# Patient Record
Sex: Female | Born: 1980 | Race: White | Hispanic: No | Marital: Married | Smoking: Former smoker
Health system: Southern US, Community
[De-identification: ages and names within clinical notes are randomized; demographics above are authoritative.]

## PROBLEM LIST (undated history)

## (undated) DIAGNOSIS — K219 Gastro-esophageal reflux disease without esophagitis: Secondary | ICD-10-CM

## (undated) DIAGNOSIS — Z91018 Allergy to other foods: Secondary | ICD-10-CM

## (undated) DIAGNOSIS — Z9889 Other specified postprocedural states: Secondary | ICD-10-CM

## (undated) DIAGNOSIS — R42 Dizziness and giddiness: Secondary | ICD-10-CM

## (undated) DIAGNOSIS — I10 Essential (primary) hypertension: Secondary | ICD-10-CM

## (undated) DIAGNOSIS — IMO0001 Reserved for inherently not codable concepts without codable children: Secondary | ICD-10-CM

## (undated) HISTORY — DX: Allergy to other foods: Z91.018

## (undated) HISTORY — DX: Essential (primary) hypertension: I10

## (undated) HISTORY — PX: KNEE RECONSTRUCTION: SHX5883

---

## 2012-05-29 ENCOUNTER — Emergency Department (HOSPITAL_COMMUNITY): Payer: Medicaid - Out of State

## 2012-05-29 ENCOUNTER — Encounter (HOSPITAL_COMMUNITY): Payer: Self-pay | Admitting: Emergency Medicine

## 2012-05-29 ENCOUNTER — Emergency Department (HOSPITAL_COMMUNITY)
Admission: EM | Admit: 2012-05-29 | Discharge: 2012-05-29 | Disposition: A | Discharge status: Home / Self Care | Payer: Medicaid - Out of State | Attending: Emergency Medicine | Admitting: Emergency Medicine

## 2012-05-29 DIAGNOSIS — R42 Dizziness and giddiness: Secondary | ICD-10-CM | POA: Insufficient documentation

## 2012-05-29 DIAGNOSIS — R0789 Other chest pain: Secondary | ICD-10-CM

## 2012-05-29 DIAGNOSIS — R202 Paresthesia of skin: Secondary | ICD-10-CM

## 2012-05-29 DIAGNOSIS — R209 Unspecified disturbances of skin sensation: Secondary | ICD-10-CM | POA: Insufficient documentation

## 2012-05-29 DIAGNOSIS — R5383 Other fatigue: Secondary | ICD-10-CM | POA: Insufficient documentation

## 2012-05-29 DIAGNOSIS — M545 Low back pain, unspecified: Secondary | ICD-10-CM | POA: Insufficient documentation

## 2012-05-29 DIAGNOSIS — R071 Chest pain on breathing: Secondary | ICD-10-CM | POA: Insufficient documentation

## 2012-05-29 DIAGNOSIS — Z87891 Personal history of nicotine dependence: Secondary | ICD-10-CM | POA: Insufficient documentation

## 2012-05-29 DIAGNOSIS — R5381 Other malaise: Secondary | ICD-10-CM | POA: Insufficient documentation

## 2012-05-29 DIAGNOSIS — Z79899 Other long term (current) drug therapy: Secondary | ICD-10-CM | POA: Insufficient documentation

## 2012-05-29 DIAGNOSIS — R0602 Shortness of breath: Secondary | ICD-10-CM | POA: Insufficient documentation

## 2012-05-29 LAB — COMPREHENSIVE METABOLIC PANEL
BUN: 10 mg/dL (ref 6–23)
CO2: 28 mEq/L (ref 19–32)
Chloride: 101 mEq/L (ref 96–112)
Creatinine, Ser: 0.74 mg/dL (ref 0.50–1.10)
GFR calc non Af Amer: >90 mL/min (ref >90)
Glucose, Bld: 85 mg/dL (ref 70–99)
Total Bilirubin: 0.4 mg/dL (ref 0.3–1.2)

## 2012-05-29 LAB — CBC WITH DIFFERENTIAL/PLATELET
Basophils Absolute: 0 10*3/uL (ref 0.0–0.1)
Eosinophils Absolute: 0.4 10*3/uL (ref 0.0–0.7)
Lymphocytes Relative: 27 % (ref 12–46)
Lymphs Abs: 4.9 10*3/uL — ABNORMAL HIGH (ref 0.7–4.0)
MCH: 30.1 pg (ref 26.0–34.0)
Neutrophils Relative %: 67 % (ref 43–77)
Platelets: 273 10*3/uL (ref 150–400)
RBC: 4.49 MIL/uL (ref 3.87–5.11)
WBC: 17.9 10*3/uL — ABNORMAL HIGH (ref 4.0–10.5)

## 2012-05-29 LAB — URINALYSIS, ROUTINE W REFLEX MICROSCOPIC
Bilirubin Urine: NEGATIVE
Nitrite: NEGATIVE
Protein, ur: NEGATIVE mg/dL
Specific Gravity, Urine: 1.015 (ref 1.005–1.030)
Urobilinogen, UA: 0.2 mg/dL (ref 0.0–1.0)

## 2012-05-29 LAB — D-DIMER, QUANTITATIVE: D-Dimer, Quant: <0.27 ug/mL-FEU (ref 0.00–0.48)

## 2012-05-29 LAB — TROPONIN I: Troponin I: <0.3 ng/mL (ref <0.30)

## 2012-05-29 LAB — URINE MICROSCOPIC-ADD ON

## 2012-05-29 MED ORDER — NAPROXEN 250 MG PO TABS: 250.0000 mg | ORAL_TABLET | Freq: Two times a day (BID) | ORAL | Status: DC

## 2012-05-29 MED ORDER — KETOROLAC TROMETHAMINE 30 MG/ML IJ SOLN
30.0000 mg | Freq: Once | INTRAMUSCULAR | Status: AC
Start: 2012-05-29 — End: 2012-05-29
  Administered 2012-05-29: 30 mg via INTRAVENOUS
  Filled 2012-05-29: qty 1

## 2012-05-29 NOTE — ED Notes (Signed)
Pt c/o weakness x2 weeks and chest pain, dizziness since yesterday. Pt states she's been seen by PCP but "he pushes it off". Pt describes chest pain as tight and pulling. Pt denies SOB, headache, NVD.

## 2012-05-29 NOTE — ED Notes (Signed)
Patient c/o mid-sternal chest pain that radiates under breast bilaterally starting yesterday morning. Patient reports shortness of breath, dizziness, weakness, and back pain. Patient also reports feeling like heart heart is racing. Patient states "I haven't been feeling good for a while but my primary doctor just shrugs it off." Per patient numbness and tingling feeling throughout body x2 months.

## 2012-05-29 NOTE — ED Provider Notes (Signed)
History     CSN: [id number]  Arrival date & time 05/29/12  1414   First MD Initiated Contact with Patient 05/29/12 1541      Chief Complaint  Patient presents with  . Chest Pain  . Dizziness  . Numbness    HPI Pt was seen at 1605.   Per pt, c/o gradual onset and persistence of constant mid-sternal chest "pain" for the past "while," worse over the past 2 days.  Pt states the pain worsens with palpation of the area and body position changes.  Describes the pain as "pulling" and "tight."  Has been associated with palpitations, SOB, "dizziness," and generalized weakness.  States her entire body has been feeling "numb and tingling" for the past 2 months.  Pt also c/o gradual onset and persistence of constant lower back "pain" for the past several weeks. Pain worsens with palpation of the area and body position changes. Denies incont/retention of bowel or bladder, no saddle anesthesia, no focal motor weakness, no tingling/numbness in extremities, no fevers, no injury, no abd pain, no dysuria/hematuria, no vaginal bleeding/discharge. Pt states she has been eval by her PMD several times for same but "he just pushes it off."       History reviewed. No pertinent past medical history.  Past Surgical History  Procedure Laterality Date  . Cesarean section      Family History  Problem Relation Age of Onset  . Cancer Other   . Diabetes Other     History  Substance Use Topics  . Smoking status: Former Smoker -- 0.50 packs/day for 3 years    Types: Cigarettes    Quit date: 03/14/1990  . Smokeless tobacco: Never Used  . Alcohol Use: No    OB History   Grav Para Term Preterm Abortions TAB SAB Ect Mult Living   2 1 1  1  1   1       Review of Systems ROS: Statement: All systems negative except as marked or noted in the HPI; Constitutional: Negative for fever and chills. +generalized weakness.; ; Eyes: Negative for eye pain, redness and discharge. ; ; ENMT: Negative for ear pain,  hoarseness, nasal congestion, sinus pressure and sore throat. ; ; Cardiovascular: +CP, SOB, palpitations. Negative for diaphoresis, and peripheral edema. ; ; Respiratory: Negative for cough, wheezing and stridor. ; ; Gastrointestinal: Negative for nausea, vomiting, diarrhea, abdominal pain, blood in stool, hematemesis, jaundice and rectal bleeding. . ; ; Genitourinary: Negative for dysuria, flank pain and hematuria. ; ; Musculoskeletal: +LBP.  Negative for neck pain. Negative for swelling and trauma.; ; Skin: Negative for pruritus, rash, abrasions, blisters, bruising and skin lesion.; ; Neuro: +paresthesias. Negative for headache, lightheadedness and neck stiffness. Negative for weakness, altered level of consciousness , altered mental status, extremity weakness, involuntary movement, seizure and syncope.     Allergies  Review of patient's allergies indicates no known allergies.  Home Medications   Current Outpatient Rx  Name  Route  Sig  Dispense  Refill  . omeprazole (PRILOSEC) 20 MG capsule   Oral   Take 20 mg by mouth every morning.           BP 104/68  Pulse 75  Temp(Src) 98.7 F (37.1 C) (Oral)  Resp 18  Ht 5\' 4"  (1.626 m)  Wt 225 lb (102.059 kg)  BMI 38.6 kg/m2  SpO2 98%  LMP 05/08/2012  Physical Exam 1610: Physical examination:  Nursing notes reviewed; Vital signs and O2 SAT reviewed;  Constitutional: Well  developed, Well nourished, Well hydrated, In no acute distress; Head:  Normocephalic, atraumatic; Eyes: EOMI, PERRL, No scleral icterus; ENMT: Mouth and pharynx normal, Mucous membranes moist; Neck: Supple, Full range of motion, No lymphadenopathy; Cardiovascular: Regular rate and rhythm, No murmur, rub, or gallop; Respiratory: Breath sounds clear & equal bilaterally, No rales, rhonchi, wheezes.  Speaking full sentences with ease, Normal respiratory effort/excursion; Chest: +bilat parasternal areas tender to palp. No rash, no soft tissue crepitus. Movement normal; Abdomen:  Soft, Nontender, Nondistended, Normal bowel sounds; Genitourinary: No CVA tenderness; Spine:  No midline CS, TS, LS tenderness.  +TTP right lower lumbar paraspinal muscles;  Extremities: Pulses normal, No tenderness, No edema, No calf edema or asymmetry.; Neuro: AA&Ox3, Major CN grossly intact.  Speech clear. Climbs on and off stretcher easily by herself. Gait steady. No gross focal motor or sensory deficits in extremities.; Skin: Color normal, Warm, Dry.; Psych:  Affect flat, poor eye contact.    ED Course  Procedures   MDM  MDM Reviewed: previous chart, nursing note and vitals Interpretation: ECG, labs and x-ray    Date: 05/29/2012  Rate: 61  Rhythm: normal sinus rhythm  QRS Axis: normal  Intervals: normal  ST/T Wave abnormalities: normal  Conduction Disutrbances:none  Narrative Interpretation:   Old EKG Reviewed: none available.  Results for orders placed during the hospital encounter of 05/29/12  COMPREHENSIVE METABOLIC PANEL      Result Value Range   Sodium 138  135 - 145 mEq/L   Potassium 4.2  3.5 - 5.1 mEq/L   Chloride 101  96 - 112 mEq/L   CO2 28  19 - 32 mEq/L   Glucose, Bld 85  70 - 99 mg/dL   BUN 10  6 - 23 mg/dL   Creatinine, Ser 1.47  0.50 - 1.10 mg/dL   Calcium 9.4  8.4 - 82.9 mg/dL   Total Protein 7.5  6.0 - 8.3 g/dL   Albumin 3.6  3.5 - 5.2 g/dL   AST 11  0 - 37 U/L   ALT 15  0 - 35 U/L   Alkaline Phosphatase 63  39 - 117 U/L   Total Bilirubin 0.4  0.3 - 1.2 mg/dL   GFR calc non Af Amer >90  >90 mL/min   GFR calc Af Amer >90  >90 mL/min  CBC WITH DIFFERENTIAL      Result Value Range   WBC 17.9 (*) 4.0 - 10.5 K/uL   RBC 4.49  3.87 - 5.11 MIL/uL   Hemoglobin 13.5  12.0 - 15.0 g/dL   HCT 56.2  13.0 - 86.5 %   MCV 88.0  78.0 - 100.0 fL   MCH 30.1  26.0 - 34.0 pg   MCHC 34.2  30.0 - 36.0 g/dL   RDW 78.4  69.6 - 29.5 %   Platelets 273  150 - 400 K/uL   Neutrophils Relative 67  43 - 77 %   Neutro Abs 12.0 (*) 1.7 - 7.7 K/uL   Lymphocytes Relative 27   12 - 46 %   Lymphs Abs 4.9 (*) 0.7 - 4.0 K/uL   Monocytes Relative 3  3 - 12 %   Monocytes Absolute 0.6  0.1 - 1.0 K/uL   Eosinophils Relative 2  0 - 5 %   Eosinophils Absolute 0.4  0.0 - 0.7 K/uL   Basophils Relative 0  0 - 1 %   Basophils Absolute 0.0  0.0 - 0.1 K/uL  TROPONIN I      Result  Value Range   Troponin I <0.30  <0.30 ng/mL  D-DIMER, QUANTITATIVE      Result Value Range   D-Dimer, Quant <0.27  0.00 - 0.48 ug/mL-FEU  URINALYSIS, ROUTINE W REFLEX MICROSCOPIC      Result Value Range   Color, Urine YELLOW  YELLOW   APPearance CLEAR  CLEAR   Specific Gravity, Urine 1.015  1.005 - 1.030   pH 7.5  5.0 - 8.0   Glucose, UA NEGATIVE  NEGATIVE mg/dL   Hgb urine dipstick TRACE (*) NEGATIVE   Bilirubin Urine NEGATIVE  NEGATIVE   Ketones, ur NEGATIVE  NEGATIVE mg/dL   Protein, ur NEGATIVE  NEGATIVE mg/dL   Urobilinogen, UA 0.2  0.0 - 1.0 mg/dL   Nitrite NEGATIVE  NEGATIVE   Leukocytes, UA NEGATIVE  NEGATIVE  PREGNANCY, URINE      Result Value Range   Preg Test, Ur NEGATIVE  NEGATIVE  URINE MICROSCOPIC-ADD ON      Result Value Range   Squamous Epithelial / LPF FEW (*) RARE   WBC, UA 0-2  <3 WBC/hpf   RBC / HPF 0-2  <3 RBC/hpf   Bacteria, UA RARE  RARE   Dg Chest 2 View 05/29/2012  *RADIOLOGY REPORT*  Clinical Data: Chest pain. Dizziness.  Numbness.  Coughing.  CHEST - 2 VIEW  Comparison: 05/19/2012.  Findings: Cardiac silhouette is normal size and shape.  Mediastinal and hilar contours appear stable and within normal limits.  No pulmonary infiltrates or masses are seen. No pleural abnormality is evident. Bones appear average for age.  IMPRESSION: Stable appearance of chest.  No acute or active cardiopulmonary or pleural abnormalities are evident.   Original Report Authenticated By: Onalee Hua Call    Dg Lumbar Spine Complete 05/29/2012  *RADIOLOGY REPORT*  Clinical Data: Low back pain  LUMBAR SPINE - COMPLETE 4+ VIEW  Comparison: 05/19/2012  Findings: Normal alignment.  No fracture or  mass or pars defect. Disc spaces are maintained.  IMPRESSION: Negative   Original Report Authenticated By: Janeece Riggers, M.D.     [phone]:  Pt states she feels better after meds and wants to go home now.  Not symptomatic during orthostatic VS.  WBC elevated, but no signs of infection:  no fever, no UTI, no pneumonia on CXR, abd remains benign. Doubt PE with negative d-dimer and low risk Wells.  Doubt ACS with constant pain x2 days with normal troponin and EKG.  Will tx pain symptomatically at this time. Dx and testing d/w pt.  Questions answered.  Verb understanding, agreeable to d/c home with outpt f/u.             Laray Anger, DO 06/01/12 1935

## 2012-07-12 ENCOUNTER — Emergency Department (HOSPITAL_COMMUNITY)
Admission: EM | Admit: 2012-07-12 | Discharge: 2012-07-13 | Disposition: A | Discharge status: Home / Self Care | Payer: Medicaid - Out of State | Attending: Emergency Medicine | Admitting: Emergency Medicine

## 2012-07-12 ENCOUNTER — Encounter (HOSPITAL_COMMUNITY): Payer: Self-pay | Admitting: *Deleted

## 2012-07-12 DIAGNOSIS — Z87891 Personal history of nicotine dependence: Secondary | ICD-10-CM | POA: Insufficient documentation

## 2012-07-12 DIAGNOSIS — R42 Dizziness and giddiness: Secondary | ICD-10-CM | POA: Insufficient documentation

## 2012-07-12 DIAGNOSIS — K219 Gastro-esophageal reflux disease without esophagitis: Secondary | ICD-10-CM | POA: Insufficient documentation

## 2012-07-12 DIAGNOSIS — Z79899 Other long term (current) drug therapy: Secondary | ICD-10-CM | POA: Insufficient documentation

## 2012-07-12 DIAGNOSIS — R209 Unspecified disturbances of skin sensation: Secondary | ICD-10-CM | POA: Insufficient documentation

## 2012-07-12 HISTORY — DX: Reserved for inherently not codable concepts without codable children: IMO0001

## 2012-07-12 HISTORY — DX: Gastro-esophageal reflux disease without esophagitis: K21.9

## 2012-07-12 HISTORY — DX: Dizziness and giddiness: R42

## 2012-07-12 NOTE — ED Notes (Signed)
MD at bedside. 

## 2012-07-12 NOTE — ED Notes (Signed)
Pt c/o left sided numbness and tingling.extending from head to toe. States she was diagnosed with vertigo x 2 months and fluid in her ears on Tuesday. Denies LOC, difficulty breathing.

## 2012-07-12 NOTE — ED Notes (Signed)
Vertigo for 2 mos. Seen by MD,and dx with fld in ears on Tuesday.  Today began having numbness of lt side of body 7am.

## 2012-07-13 LAB — COMPREHENSIVE METABOLIC PANEL
BUN: 18 mg/dL (ref 6–23)
CO2: 29 mEq/L (ref 19–32)
Calcium: 9.2 mg/dL (ref 8.4–10.5)
Chloride: 99 mEq/L (ref 96–112)
Creatinine, Ser: 0.78 mg/dL (ref 0.50–1.10)
GFR calc Af Amer: >90 mL/min (ref >90)
GFR calc non Af Amer: >90 mL/min (ref >90)
Glucose, Bld: 93 mg/dL (ref 70–99)
Total Bilirubin: 0.3 mg/dL (ref 0.3–1.2)

## 2012-07-13 LAB — CBC WITH DIFFERENTIAL/PLATELET
Eosinophils Relative: 3 % (ref 0–5)
HCT: 37.5 % (ref 36.0–46.0)
Hemoglobin: 12.8 g/dL (ref 12.0–15.0)
Lymphocytes Relative: 38 % (ref 12–46)
Lymphs Abs: 4.8 10*3/uL — ABNORMAL HIGH (ref 0.7–4.0)
MCV: 88 fL (ref 78.0–100.0)
Monocytes Absolute: 0.5 10*3/uL (ref 0.1–1.0)
Monocytes Relative: 4 % (ref 3–12)
RBC: 4.26 MIL/uL (ref 3.87–5.11)
RDW: 12.4 % (ref 11.5–15.5)
WBC: 12.7 10*3/uL — ABNORMAL HIGH (ref 4.0–10.5)

## 2012-07-13 NOTE — ED Provider Notes (Signed)
History     CSN: [id number]  Arrival date & time 07/12/12  2002   First MD Initiated Contact with Patient 07/12/12 2305      Chief Complaint  Patient presents with  . Dizziness    (Consider location/radiation/quality/duration/timing/severity/associated sxs/prior treatment) HPI Comments: Patient presents with complaints of dizziness for the past several days.  She describes this as a spinning sensation that is associated with movement and position.  She was seen by her pcp for this yesterday and had a negative ct.  She was diagnosed with vertigo and prescribed meclizine which did not help.  She started to experience numbness to the left side of her face and left arm today.  No weakness, headache, but continues with feeling dizzy.  Symptoms are aggravated with movement, and improved with rest.  No visual complaints.    The history is provided by the patient.    Past Medical History  Diagnosis Date  . Vertigo   . Reflux     Past Surgical History  Procedure Laterality Date  . Cesarean section      Family History  Problem Relation Age of Onset  . Cancer Other   . Diabetes Other     History  Substance Use Topics  . Smoking status: Former Smoker -- 0.50 packs/day for 3 years    Types: Cigarettes    Quit date: 03/14/1990  . Smokeless tobacco: Never Used  . Alcohol Use: No    OB History   Grav Para Term Preterm Abortions TAB SAB Ect Mult Living   2 1 1  1  1   1       Review of Systems  All other systems reviewed and are negative.    Allergies  Review of patient's allergies indicates no known allergies.  Home Medications   Current Outpatient Rx  Name  Route  Sig  Dispense  Refill  . naproxen (NAPROSYN) 250 MG tablet   Oral   Take 1 tablet (250 mg total) by mouth 2 (two) times daily with a meal.   14 tablet   0   . omeprazole (PRILOSEC) 20 MG capsule   Oral   Take 20 mg by mouth every morning.           BP 108/56  Pulse 63  Temp(Src) 98.1 F (36.7  C) (Oral)  Resp 18  Ht 5\' 4"  (1.626 m)  Wt 225 lb (102.059 kg)  BMI 38.6 kg/m2  SpO2 99%  LMP 07/09/2012  Physical Exam  Nursing note and vitals reviewed. Constitutional: She is oriented to person, place, and time. She appears well-developed and well-nourished. No distress.  HENT:  Head: Normocephalic and atraumatic.  Mouth/Throat: Oropharynx is clear and moist.  TM's clear bilaterally.  Neck: Normal range of motion. Neck supple.  Cardiovascular: Normal rate and regular rhythm.  Exam reveals no gallop and no friction rub.   No murmur heard. Pulmonary/Chest: Effort normal and breath sounds normal. No respiratory distress. She has no wheezes.  Abdominal: Soft. Bowel sounds are normal. She exhibits no distension. There is no tenderness.  Musculoskeletal: Normal range of motion.  Neurological: She is alert and oriented to person, place, and time. No cranial nerve deficit. She exhibits normal muscle tone. Coordination normal.  Skin: Skin is warm and dry. She is not diaphoretic.    ED Course  Procedures (including critical care time)  Labs Reviewed  CBC WITH DIFFERENTIAL - Abnormal; Notable for the following:    WBC 12.7 (*)  Lymphs Abs 4.8 (*)    All other components within normal limits  COMPREHENSIVE METABOLIC PANEL   No results found.   1. Dizziness   2. Vertigo       MDM  CT from yesterday negative.  The labs today are unremarkable as well.  To my exam, she is neurologically intact without any deficits of cranial nerves, strength, or coordination.  I do not feel inclined to repeat the ct scan tonight as her exam is non-focal and symptoms do not sound like an acute cva.  The labs are unremarkable and I believe stable for discharge.  She is to see her pcp if not improving in the next few days.        Geoffery Lyons, MD 07/13/12 [phone]

## 2012-08-22 ENCOUNTER — Encounter (HOSPITAL_COMMUNITY): Payer: Self-pay | Admitting: Emergency Medicine

## 2012-08-22 ENCOUNTER — Emergency Department (HOSPITAL_COMMUNITY)
Admission: EM | Admit: 2012-08-22 | Discharge: 2012-08-23 | Disposition: A | Discharge status: Home / Self Care | Payer: Medicaid - Out of State | Attending: Emergency Medicine | Admitting: Emergency Medicine

## 2012-08-22 DIAGNOSIS — K219 Gastro-esophageal reflux disease without esophagitis: Secondary | ICD-10-CM | POA: Insufficient documentation

## 2012-08-22 DIAGNOSIS — Z87891 Personal history of nicotine dependence: Secondary | ICD-10-CM | POA: Insufficient documentation

## 2012-08-22 DIAGNOSIS — Z79899 Other long term (current) drug therapy: Secondary | ICD-10-CM | POA: Insufficient documentation

## 2012-08-22 DIAGNOSIS — R6883 Chills (without fever): Secondary | ICD-10-CM | POA: Insufficient documentation

## 2012-08-22 DIAGNOSIS — R079 Chest pain, unspecified: Secondary | ICD-10-CM | POA: Insufficient documentation

## 2012-08-22 DIAGNOSIS — J020 Streptococcal pharyngitis: Secondary | ICD-10-CM | POA: Insufficient documentation

## 2012-08-22 DIAGNOSIS — Z3202 Encounter for pregnancy test, result negative: Secondary | ICD-10-CM | POA: Insufficient documentation

## 2012-08-22 NOTE — ED Provider Notes (Addendum)
History  This chart was scribed for [employer]. Colon Branch, MD by Ardelia Mems, ED Scribe. This patient was seen in room [facility] and the patient's care was started at 11:20 PM.   CSN: [id number]  Arrival date & time 08/22/12  2158     Chief Complaint  Patient presents with  . Chest Pain  . Sore Throat     The history is provided by the patient. No language interpreter was used.    HPI Comments: Angela Harding is a 32 y.o. female who presents to the Emergency Department complaining of chills, sore throat, chest  Pains that began this morning. She has felt chilled all day and her sore throat has gotten worse through the course of the day. Her chest has sharp pains when she moves.    Past Medical History  Diagnosis Date  . Vertigo   . Reflux     Past Surgical History  Procedure Laterality Date  . Cesarean section      Family History  Problem Relation Age of Onset  . Cancer Other   . Diabetes Other     History  Substance Use Topics  . Smoking status: Former Smoker -- 0.50 packs/day for 3 years    Types: Cigarettes    Quit date: 03/14/1990  . Smokeless tobacco: Never Used  . Alcohol Use: No    OB History   Grav Para Term Preterm Abortions TAB SAB Ect Mult Living   2 1 1  1  1   1       Review of Systems  Constitutional: Positive for chills. Negative for fever.       10 Systems reviewed and are negative for acute change except as noted in the HPI.  HENT: Positive for sore throat. Negative for congestion.   Eyes: Negative for discharge and redness.  Respiratory: Negative for cough and shortness of breath.   Cardiovascular: Positive for chest pain.  Gastrointestinal: Negative for vomiting and abdominal pain.  Musculoskeletal: Negative for back pain.  Skin: Negative for rash.  Neurological: Negative for syncope, numbness and headaches.  Psychiatric/Behavioral:       No behavior change.   A complete 10 system review of systems was obtained and all systems are  negative except as noted in the HPI and PMH.   Allergies  Review of patient's allergies indicates no known allergies.  Home Medications   Current Outpatient Rx  Name  Route  Sig  Dispense  Refill  . Linaclotide (LINZESS) 145 MCG CAPS   Oral   Take 145 mcg by mouth daily.         Marland Kitchen omeprazole (PRILOSEC) 20 MG capsule   Oral   Take 20 mg by mouth 2 (two) times daily.          . ranitidine (ZANTAC) 150 MG tablet   Oral   Take 150 mg by mouth at bedtime.           Triage Vitals: BP 126/62  Pulse 81  Temp(Src) 97.7 F (36.5 C) (Oral)  Resp 20  Ht 5\' 4"  (1.626 m)  Wt 230 lb (104.327 kg)  BMI 39.46 kg/m2  SpO2 100%  Physical Exam  Nursing note and vitals reviewed. Constitutional: She appears well-developed and well-nourished.  Awake, alert, nontoxic appearance.  HENT:  Head: Normocephalic and atraumatic.  Slight erythema of posterior pharynx  Eyes: EOM are normal. Pupils are equal, round, and reactive to light.  Neck: Normal range of motion. Neck supple.  Cardiovascular: Normal  rate and intact distal pulses.   Pulmonary/Chest: Effort normal and breath sounds normal. She exhibits no tenderness.  Abdominal: Soft. Bowel sounds are normal. There is no tenderness. There is no rebound.  Musculoskeletal: She exhibits no tenderness.  Baseline ROM, no obvious new focal weakness.  Neurological:  Mental status and motor strength appears baseline for patient and situation.  Skin: No rash noted.  Psychiatric: She has a normal mood and affect.    ED Course  Procedures (including critical care time)  DIAGNOSTIC STUDIES: Oxygen Saturation is 100% on RA, normal by my interpretation.    COORDINATION OF CARE: 11:21 PM- Pt advised of plan for treatment and pt agrees.  Results for orders placed during the hospital encounter of 08/22/12  RAPID STREP SCREEN      Result Value Range   Streptococcus, Group A Screen (Direct) POSITIVE (*) NEGATIVE  CBC WITH DIFFERENTIAL       Result Value Range   WBC 19.9 (*) 4.0 - 10.5 K/uL   RBC 4.31  3.87 - 5.11 MIL/uL   Hemoglobin 13.0  12.0 - 15.0 g/dL   HCT 96.2  95.2 - 84.1 %   MCV 89.1  78.0 - 100.0 fL   MCH 30.2  26.0 - 34.0 pg   MCHC 33.9  30.0 - 36.0 g/dL   RDW 32.4  40.1 - 02.7 %   Platelets 262  150 - 400 K/uL   Neutrophils Relative % 78 (*) 43 - 77 %   Neutro Abs 15.6 (*) 1.7 - 7.7 K/uL   Lymphocytes Relative 18  12 - 46 %   Lymphs Abs 3.5  0.7 - 4.0 K/uL   Monocytes Relative 3  3 - 12 %   Monocytes Absolute 0.6  0.1 - 1.0 K/uL   Eosinophils Relative 1  0 - 5 %   Eosinophils Absolute 0.2  0.0 - 0.7 K/uL   Basophils Relative 0  0 - 1 %   Basophils Absolute 0.0  0.0 - 0.1 K/uL  BASIC METABOLIC PANEL      Result Value Range   Sodium 136  135 - 145 mEq/L   Potassium 3.8  3.5 - 5.1 mEq/L   Chloride 98  96 - 112 mEq/L   CO2 28  19 - 32 mEq/L   Glucose, Bld 97  70 - 99 mg/dL   BUN 10  6 - 23 mg/dL   Creatinine, Ser 2.53  0.50 - 1.10 mg/dL   Calcium 9.2  8.4 - 66.4 mg/dL   GFR calc non Af Amer >90  >90 mL/min   GFR calc Af Amer >90  >90 mL/min  URINALYSIS, ROUTINE W REFLEX MICROSCOPIC      Result Value Range   Color, Urine YELLOW  YELLOW   APPearance CLEAR  CLEAR   Specific Gravity, Urine 1.015  1.005 - 1.030   pH 6.0  5.0 - 8.0   Glucose, UA NEGATIVE  NEGATIVE mg/dL   Hgb urine dipstick SMALL (*) NEGATIVE   Bilirubin Urine NEGATIVE  NEGATIVE   Ketones, ur NEGATIVE  NEGATIVE mg/dL   Protein, ur NEGATIVE  NEGATIVE mg/dL   Urobilinogen, UA 0.2  0.0 - 1.0 mg/dL   Nitrite NEGATIVE  NEGATIVE   Leukocytes, UA NEGATIVE  NEGATIVE  PREGNANCY, URINE      Result Value Range   Preg Test, Ur NEGATIVE  NEGATIVE  URINE MICROSCOPIC-ADD ON      Result Value Range   Squamous Epithelial / LPF RARE  RARE  WBC, UA 0-2  <3 WBC/hpf   RBC / HPF 0-2  <3 RBC/hpf     Date: 08/22/2012     2207  Rate: 88  Rhythm: normal sinus rhythm  QRS Axis: normal  Intervals: normal  ST/T Wave abnormalities: normal  Conduction  Disutrbances: none  Narrative Interpretation: unremarkable        MDM  Patient with strep throat. Given Penicillin G. Pt stable in ED with no significant deterioration in condition.The patient appears reasonably screened and/or stabilized for discharge and I doubt any other medical condition or other [facility] requiring further screening, evaluation, or treatment in the ED at this time prior to discharge.  I personally performed the services described in this documentation, which was scribed in my presence. The recorded information has been reviewed and considered.   MDM Reviewed: nursing note and vitals Interpretation: labs               Nicoletta Dress. Colon Branch, MD 08/23/12 0159  Nicoletta Dress. Colon Branch, MD 08/23/12 1610

## 2012-08-22 NOTE — ED Notes (Signed)
Performed EKG in Triage, pulled old copy from MUSE and handed both to Dr Juleen China.

## 2012-08-22 NOTE — ED Notes (Signed)
Pt c/o chest pain when she is moving around and sore throat that started this am.

## 2012-08-23 LAB — URINE MICROSCOPIC-ADD ON

## 2012-08-23 LAB — BASIC METABOLIC PANEL
BUN: 10 mg/dL (ref 6–23)
Chloride: 98 mEq/L (ref 96–112)
GFR calc Af Amer: >90 mL/min (ref >90)
GFR calc non Af Amer: >90 mL/min (ref >90)
Potassium: 3.8 mEq/L (ref 3.5–5.1)
Sodium: 136 mEq/L (ref 135–145)

## 2012-08-23 LAB — URINALYSIS, ROUTINE W REFLEX MICROSCOPIC
Leukocytes, UA: NEGATIVE
Nitrite: NEGATIVE
Specific Gravity, Urine: 1.015 (ref 1.005–1.030)
Urobilinogen, UA: 0.2 mg/dL (ref 0.0–1.0)
pH: 6 (ref 5.0–8.0)

## 2012-08-23 LAB — CBC WITH DIFFERENTIAL/PLATELET
Basophils Relative: 0 % (ref 0–1)
Eosinophils Absolute: 0.2 10*3/uL (ref 0.0–0.7)
Hemoglobin: 13 g/dL (ref 12.0–15.0)
MCH: 30.2 pg (ref 26.0–34.0)
MCHC: 33.9 g/dL (ref 30.0–36.0)
Monocytes Relative: 3 % (ref 3–12)
Neutro Abs: 15.6 10*3/uL — ABNORMAL HIGH (ref 1.7–7.7)
Neutrophils Relative %: 78 % — ABNORMAL HIGH (ref 43–77)
Platelets: 262 10*3/uL (ref 150–400)
RBC: 4.31 MIL/uL (ref 3.87–5.11)

## 2012-08-23 LAB — PREGNANCY, URINE: Preg Test, Ur: NEGATIVE

## 2012-08-23 MED ORDER — PENICILLIN G BENZATHINE 1200000 UNIT/2ML IM SUSP
1.2000 10*6.[IU] | Freq: Once | INTRAMUSCULAR | Status: AC
  Administered 2012-08-23: 1.2 10*6.[IU] via INTRAMUSCULAR
  Filled 2012-08-23: qty 2

## 2012-08-23 NOTE — ED Notes (Signed)
Pt is lying on stretcher asleep at this time.

## 2012-10-01 ENCOUNTER — Emergency Department (HOSPITAL_COMMUNITY)
Admission: EM | Admit: 2012-10-01 | Discharge: 2012-10-01 | Disposition: A | Discharge status: Home / Self Care | Payer: Medicaid - Out of State | Attending: Emergency Medicine | Admitting: Emergency Medicine

## 2012-10-01 ENCOUNTER — Encounter (HOSPITAL_COMMUNITY): Payer: Self-pay

## 2012-10-01 DIAGNOSIS — Z87891 Personal history of nicotine dependence: Secondary | ICD-10-CM | POA: Insufficient documentation

## 2012-10-01 DIAGNOSIS — Z3202 Encounter for pregnancy test, result negative: Secondary | ICD-10-CM | POA: Insufficient documentation

## 2012-10-01 DIAGNOSIS — R300 Dysuria: Secondary | ICD-10-CM | POA: Insufficient documentation

## 2012-10-01 DIAGNOSIS — R112 Nausea with vomiting, unspecified: Secondary | ICD-10-CM

## 2012-10-01 DIAGNOSIS — M549 Dorsalgia, unspecified: Secondary | ICD-10-CM | POA: Insufficient documentation

## 2012-10-01 DIAGNOSIS — R1032 Left lower quadrant pain: Secondary | ICD-10-CM | POA: Insufficient documentation

## 2012-10-01 DIAGNOSIS — R109 Unspecified abdominal pain: Secondary | ICD-10-CM

## 2012-10-01 DIAGNOSIS — K219 Gastro-esophageal reflux disease without esophagitis: Secondary | ICD-10-CM | POA: Insufficient documentation

## 2012-10-01 DIAGNOSIS — Z79899 Other long term (current) drug therapy: Secondary | ICD-10-CM | POA: Insufficient documentation

## 2012-10-01 DIAGNOSIS — R1010 Upper abdominal pain, unspecified: Secondary | ICD-10-CM

## 2012-10-01 DIAGNOSIS — R252 Cramp and spasm: Secondary | ICD-10-CM

## 2012-10-01 LAB — CBC WITH DIFFERENTIAL/PLATELET
Basophils Absolute: 0 10*3/uL (ref 0.0–0.1)
HCT: 38.2 % (ref 36.0–46.0)
Lymphocytes Relative: 14 % (ref 12–46)
Lymphs Abs: 2 10*3/uL (ref 0.7–4.0)
MCV: 89 fL (ref 78.0–100.0)
Monocytes Absolute: 0.3 10*3/uL (ref 0.1–1.0)
Neutro Abs: 12.5 10*3/uL — ABNORMAL HIGH (ref 1.7–7.7)
Platelets: 318 10*3/uL (ref 150–400)
RBC: 4.29 MIL/uL (ref 3.87–5.11)
RDW: 12.6 % (ref 11.5–15.5)
WBC: 14.8 10*3/uL — ABNORMAL HIGH (ref 4.0–10.5)

## 2012-10-01 LAB — PREGNANCY, URINE: Preg Test, Ur: NEGATIVE

## 2012-10-01 LAB — URINALYSIS, ROUTINE W REFLEX MICROSCOPIC
Bilirubin Urine: NEGATIVE
Glucose, UA: 250 mg/dL — AB
Protein, ur: NEGATIVE mg/dL
Urobilinogen, UA: 0.2 mg/dL (ref 0.0–1.0)

## 2012-10-01 LAB — COMPREHENSIVE METABOLIC PANEL
ALT: 16 U/L (ref 0–35)
AST: 12 U/L (ref 0–37)
CO2: 26 mEq/L (ref 19–32)
Chloride: 103 mEq/L (ref 96–112)
GFR calc Af Amer: >90 mL/min (ref >90)
GFR calc non Af Amer: >90 mL/min (ref >90)
Glucose, Bld: 123 mg/dL — ABNORMAL HIGH (ref 70–99)
Sodium: 138 mEq/L (ref 135–145)
Total Bilirubin: 0.2 mg/dL — ABNORMAL LOW (ref 0.3–1.2)

## 2012-10-01 LAB — URINE MICROSCOPIC-ADD ON

## 2012-10-01 MED ORDER — OXYCODONE-ACETAMINOPHEN 5-325 MG PO TABS
1.0000 | ORAL_TABLET | Freq: Once | ORAL | Status: AC
  Administered 2012-10-01: 1 via ORAL
  Filled 2012-10-01: qty 1

## 2012-10-01 MED ORDER — ONDANSETRON 8 MG PO TBDP: 8.0000 mg | ORAL_TABLET | Freq: Three times a day (TID) | ORAL | Status: DC | PRN

## 2012-10-01 MED ORDER — ONDANSETRON 8 MG PO TBDP
8.0000 mg | ORAL_TABLET | Freq: Once | ORAL | Status: AC
  Administered 2012-10-01: 8 mg via ORAL
  Filled 2012-10-01: qty 1

## 2012-10-01 MED ORDER — HYDROCODONE-ACETAMINOPHEN 5-325 MG PO TABS: 1.0000 | ORAL_TABLET | ORAL | Status: DC | PRN

## 2012-10-01 NOTE — ED Notes (Signed)
Pt c/o LLQ pain with vomiting x 2 days.  Denies diarrhea.  LBM was yesterday.  Reports yesterday, it was hard for her to void.  Says felt like she had to void all day but only small amounts would come out.  Denies any abnormal vaginal bleeding or discharge.

## 2012-10-01 NOTE — ED Provider Notes (Signed)
History  This chart was scribed for Lyanne Co, MD by Ardeen Jourdain, ED Scribe. This patient was seen in room APA03/APA03 and the patient's care was started at 1816.  CSN: [id number] Arrival date & time 10/01/12  1653   First MD Initiated Contact with Patient 10/01/12 1816     Chief Complaint  Patient presents with  . Abdominal Pain    Patient is a 32 y.o. female presenting with abdominal pain. The history is provided by the patient. No language interpreter was used.  Abdominal Pain This is a new problem. The current episode started 2 days ago. The problem occurs constantly. The problem has been gradually worsening. Associated symptoms include abdominal pain. Pertinent negatives include no chest pain, no headaches and no shortness of breath. The symptoms are aggravated by bending and eating. The symptoms are relieved by lying down and position. She has tried nothing for the symptoms. The treatment provided no relief.    HPI Comments: Angela Harding is a 32 y.o. female who presents to the Emergency Department complaining of gradual onset, gradually worsening, constant LLQ abdominal pain with associated dysuria, back pain, nausea and emesis. She states her last BM was yesterday. She states it has been difficult for her to void. She states the pain is aggravated by eating. She states she has felt like she needed to void all day but only small amounts or urine would come out. She denies any fever, SOB, hematuria, diarrhea, abnormal vaginal bleeding or discharge. Pt states she was evaluated by her PCP today who told her to come to the ED. She states her LMP was around 1 month ago.   Past Medical History  Diagnosis Date  . Vertigo   . Reflux    Past Surgical History  Procedure Laterality Date  . Cesarean section     Family History  Problem Relation Age of Onset  . Cancer Other   . Diabetes Other    History  Substance Use Topics  . Smoking status: Former Smoker -- 0.50 packs/day  for 3 years    Types: Cigarettes    Quit date: 03/14/1990  . Smokeless tobacco: Never Used  . Alcohol Use: No   OB History   Grav Para Term Preterm Abortions TAB SAB Ect Mult Living   2 1 1  1  1   1      Review of Systems  Respiratory: Negative for shortness of breath.   Cardiovascular: Negative for chest pain.  Gastrointestinal: Positive for abdominal pain.  Neurological: Negative for headaches.  All other systems reviewed and are negative.   A complete 10 system review of systems was obtained and all systems are negative except as noted in the HPI and PMH.    Allergies  Review of patient's allergies indicates no known allergies.  Home Medications   Current Outpatient Rx  Name  Route  Sig  Dispense  Refill  . Linaclotide (LINZESS) 145 MCG CAPS   Oral   Take 145 mcg by mouth daily.         Marland Kitchen omeprazole (PRILOSEC) 20 MG capsule   Oral   Take 20 mg by mouth 2 (two) times daily.          . ranitidine (ZANTAC) 150 MG tablet   Oral   Take 150 mg by mouth at bedtime.          Triage Vitals: BP 148/88  Pulse 70  Temp(Src) 99 F (37.2 C) (Oral)  Resp 19  Ht  5\' 4"  (1.626 m)  Wt 225 lb (102.059 kg)  BMI 38.6 kg/m2  SpO2 99%  Physical Exam  Nursing note and vitals reviewed. Constitutional: She is oriented to person, place, and time. She appears well-developed and well-nourished. No distress.  HENT:  Head: Normocephalic and atraumatic.  Eyes: EOM are normal.  Neck: Normal range of motion.  Cardiovascular: Normal rate, regular rhythm and normal heart sounds.   Pulmonary/Chest: Effort normal and breath sounds normal.  Abdominal: Soft. She exhibits no distension. There is no rebound and no guarding.  Mild upper abd tenderness without guarding or rebound  Musculoskeletal: Normal range of motion. She exhibits no edema and no tenderness.  No lower extremity swelling or tenderness   Neurological: She is alert and oriented to person, place, and time.  Skin: Skin is  warm and dry.  Psychiatric: She has a normal mood and affect. Judgment normal.    ED Course  Procedures (including critical care time)  DIAGNOSTIC STUDIES: Oxygen Saturation is 99% on room air, normal by my interpretation.    COORDINATION OF CARE:  6:25 PM-Discussed treatment plan which includes UA, pregnancy test, CBC, CMP, lipase, anti-nausea medication and pain medication with pt at bedside and pt agreed to plan.   Labs Reviewed  URINALYSIS, ROUTINE W REFLEX MICROSCOPIC - Abnormal; Notable for the following:    Glucose, UA 250 (*)    Hgb urine dipstick SMALL (*)    All other components within normal limits  URINE MICROSCOPIC-ADD ON - Abnormal; Notable for the following:    Squamous Epithelial / LPF FEW (*)    All other components within normal limits  CBC WITH DIFFERENTIAL - Abnormal; Notable for the following:    WBC 14.8 (*)    Neutrophils Relative % 84 (*)    Neutro Abs 12.5 (*)    Monocytes Relative 2 (*)    All other components within normal limits  COMPREHENSIVE METABOLIC PANEL - Abnormal; Notable for the following:    Glucose, Bld 123 (*)    Total Bilirubin 0.2 (*)    All other components within normal limits  PREGNANCY, URINE  LIPASE, BLOOD  D-DIMER, QUANTITATIVE   No results found. 1. Abdominal pain   2. Leg cramps     MDM  Symptoms could represent biliary colic.  Only mild upper abdominal tenderness at this time.  Negative Murphy's sign.  My suspicion for cholecystitis is very low.  Patient will return first thing in the morning for an ultrasound of her abdomen to evaluate for cholelithiasis.  The patient was concerned about the possibilities of blood clots in her lower legs given her cramping.  D-dimer is negative.  No indication for additional testing regarding her lower legs besides PCP followup.   I personally performed the services described in this documentation, which was scribed in my presence. The recorded information has been reviewed and is  accurate.      Lyanne Co, MD 10/01/12 [phone]

## 2012-10-02 ENCOUNTER — Ambulatory Visit (HOSPITAL_COMMUNITY)
Admit: 2012-10-02 | Discharge: 2012-10-02 | Disposition: A | Discharge status: Home / Self Care | Payer: Medicaid - Out of State | Attending: Emergency Medicine | Admitting: Emergency Medicine

## 2012-10-02 DIAGNOSIS — R112 Nausea with vomiting, unspecified: Secondary | ICD-10-CM

## 2012-10-02 DIAGNOSIS — R1010 Upper abdominal pain, unspecified: Secondary | ICD-10-CM

## 2012-10-02 DIAGNOSIS — R109 Unspecified abdominal pain: Secondary | ICD-10-CM | POA: Insufficient documentation

## 2013-04-03 ENCOUNTER — Encounter (HOSPITAL_COMMUNITY): Payer: Self-pay | Admitting: Emergency Medicine

## 2013-04-03 ENCOUNTER — Emergency Department (HOSPITAL_COMMUNITY)
Admission: EM | Admit: 2013-04-03 | Discharge: 2013-04-03 | Disposition: A | Discharge status: Home / Self Care | Payer: Medicaid - Out of State | Attending: Emergency Medicine | Admitting: Emergency Medicine

## 2013-04-03 DIAGNOSIS — IMO0002 Reserved for concepts with insufficient information to code with codable children: Secondary | ICD-10-CM | POA: Insufficient documentation

## 2013-04-03 DIAGNOSIS — N390 Urinary tract infection, site not specified: Secondary | ICD-10-CM | POA: Insufficient documentation

## 2013-04-03 DIAGNOSIS — Z79899 Other long term (current) drug therapy: Secondary | ICD-10-CM | POA: Insufficient documentation

## 2013-04-03 DIAGNOSIS — N760 Acute vaginitis: Secondary | ICD-10-CM | POA: Insufficient documentation

## 2013-04-03 DIAGNOSIS — K219 Gastro-esophageal reflux disease without esophagitis: Secondary | ICD-10-CM | POA: Insufficient documentation

## 2013-04-03 DIAGNOSIS — Z87891 Personal history of nicotine dependence: Secondary | ICD-10-CM | POA: Insufficient documentation

## 2013-04-03 DIAGNOSIS — Z3202 Encounter for pregnancy test, result negative: Secondary | ICD-10-CM | POA: Insufficient documentation

## 2013-04-03 DIAGNOSIS — R42 Dizziness and giddiness: Secondary | ICD-10-CM | POA: Insufficient documentation

## 2013-04-03 LAB — URINALYSIS, ROUTINE W REFLEX MICROSCOPIC
Bilirubin Urine: NEGATIVE
Glucose, UA: NEGATIVE mg/dL
Ketones, ur: NEGATIVE mg/dL
NITRITE: NEGATIVE
PROTEIN: NEGATIVE mg/dL
SPECIFIC GRAVITY, URINE: 1.025 (ref 1.005–1.030)
UROBILINOGEN UA: 0.2 mg/dL (ref 0.0–1.0)
pH: 6 (ref 5.0–8.0)

## 2013-04-03 LAB — PREGNANCY, URINE: PREG TEST UR: NEGATIVE

## 2013-04-03 LAB — URINE MICROSCOPIC-ADD ON

## 2013-04-03 MED ORDER — IBUPROFEN 800 MG PO TABS
800.0000 mg | ORAL_TABLET | Freq: Once | ORAL | Status: AC
  Administered 2013-04-03: 800 mg via ORAL

## 2013-04-03 MED ORDER — FLUCONAZOLE 100 MG PO TABS
200.0000 mg | ORAL_TABLET | Freq: Once | ORAL | Status: AC
  Administered 2013-04-03: 200 mg via ORAL
  Filled 2013-04-03: qty 2

## 2013-04-03 MED ORDER — IBUPROFEN 800 MG PO TABS
ORAL_TABLET | ORAL | Status: AC
  Filled 2013-04-03: qty 1

## 2013-04-03 NOTE — Discharge Instructions (Signed)
Your urine analysis is negative for acute infection. I suspect that you have a vaginitis following the use of antibiotics. Cool tap water soaks maybe helpful. You've been treated with Diflucan here in the emergency department. Please see your physician if not improving. Vaginitis Vaginitis is an inflammation of the vagina. It is most often caused by a change in the normal balance of the bacteria and yeast that live in the vagina. This change in balance causes an overgrowth of certain bacteria or yeast, which causes the inflammation. There are different types of vaginitis, but the most common types are:  Bacterial vaginosis.  Yeast infection (candidiasis).  Trichomoniasis vaginitis. This is a sexually transmitted infection (STI).  Viral vaginitis.  Atropic vaginitis.  Allergic vaginitis. CAUSES  The cause depends on the type of vaginitis. Vaginitis can be caused by:  Bacteria (bacterial vaginosis).  Yeast (yeast infection).  A parasite (trichomoniasis vaginitis)  A virus (viral vaginitis).  Low hormone levels (atrophic vaginitis). Low hormone levels can occur during pregnancy, breastfeeding, or after menopause.  Irritants, such as bubble baths, scented tampons, and feminine sprays (allergic vaginitis). Other factors can change the normal balance of the yeast and bacteria that live in the vagina. These include:  Antibiotic medicines.  Poor hygiene.  Diaphragms, vaginal sponges, spermicides, birth control pills, and intrauterine devices (IUD).  Sexual intercourse.  Infection.  Uncontrolled diabetes.  A weakened immune system. SYMPTOMS  Symptoms can vary depending on the cause of the vaginitis. Common symptoms include:  Abnormal vaginal discharge.  The discharge is white, gray, or yellow with bacterial vaginosis.  The discharge is thick, white, and cheesy with a yeast infection.  The discharge is frothy and yellow or greenish with trichomoniasis.  A bad vaginal  odor.  The odor is fishy with bacterial vaginosis.  Vaginal itching, pain, or swelling.  Painful intercourse.  Pain or burning when urinating. Sometimes, there are no symptoms. TREATMENT  Treatment will vary depending on the type of infection.   Bacterial vaginosis and trichomoniasis are often treated with antibiotic creams or pills.  Yeast infections are often treated with antifungal medicines, such as vaginal creams or suppositories.  Viral vaginitis has no cure, but symptoms can be treated with medicines that relieve discomfort. Your sexual partner should be treated as well.  Atrophic vaginitis may be treated with an estrogen cream, pill, suppository, or vaginal ring. If vaginal dryness occurs, lubricants and moisturizing creams may help. You may be told to avoid scented soaps, sprays, or douches.  Allergic vaginitis treatment involves quitting the use of the product that is causing the problem. Vaginal creams can be used to treat the symptoms. HOME CARE INSTRUCTIONS   Take all medicines as directed by your caregiver.  Keep your genital area clean and dry. Avoid soap and only rinse the area with water.  Avoid douching. It can remove the healthy bacteria in the vagina.  Do not use tampons or have sexual intercourse until your vaginitis has been treated. Use sanitary pads while you have vaginitis.  Wipe from front to back. This avoids the spread of bacteria from the rectum to the vagina.  Let air reach your genital area.  Wear cotton underwear to decrease moisture buildup.  Avoid wearing underwear while you sleep until your vaginitis is gone.  Avoid tight pants and underwear or nylons without a cotton panel.  Take off wet clothing (especially bathing suits) as soon as possible.  Use mild, non-scented products. Avoid using irritants, such as:  Scented feminine sprays.  Fabric softeners.  Scented detergents.  Scented tampons.  Scented soaps or bubble  baths.  Practice safe sex and use condoms. Condoms may prevent the spread of trichomoniasis and viral vaginitis. SEEK MEDICAL CARE IF:   You have abdominal pain.  You have a fever or persistent symptoms for more than 2 3 days.  You have a fever and your symptoms suddenly get worse. Document Released: 12/26/2006 Document Revised: 11/23/2011 Document Reviewed: 08/11/2011 [facility] Patient Information 2014 [place]ExitCare, [place].

## 2013-04-03 NOTE — ED Notes (Signed)
Patient c/o burning with urination and pressure.

## 2013-04-03 NOTE — ED Provider Notes (Signed)
CSN: [id number]     Arrival date & time 04/03/13  2014 History   First MD Initiated Contact with Patient 04/03/13 2104     Chief Complaint  Patient presents with  . Urinary Tract Infection   (Consider location/radiation/quality/duration/timing/severity/associated sxs/prior Treatment) HPI Comments: Patient states that she has recently been treated with Augmentin for an infection. She now has been having 3 days of burning with urination. She also has a sensation of pressure when urinating. She has noticed a discharge with increased redness in the vaginal area. There is some discomfort most of the time, but it is extremely uncomfortable with urination.  Patient is a 33 y.o. female presenting with urinary tract infection. The history is provided by the patient.  Urinary Tract Infection This is a new problem. The problem occurs daily. The problem has been gradually worsening. Pertinent negatives include no abdominal pain, arthralgias, chest pain, coughing, fever, neck pain or vomiting. Associated symptoms comments: Pressure sensation Burning with urination.. Exacerbated by: urination. She has tried nothing for the symptoms. The treatment provided no relief.    Past Medical History  Diagnosis Date  . Vertigo   . Reflux    Past Surgical History  Procedure Laterality Date  . Cesarean section     Family History  Problem Relation Age of Onset  . Cancer Other   . Diabetes Other    History  Substance Use Topics  . Smoking status: Former Smoker -- 0.50 packs/day for 3 years    Types: Cigarettes    Quit date: 03/14/1990  . Smokeless tobacco: Never Used  . Alcohol Use: No   OB History   Grav Para Term Preterm Abortions TAB SAB Ect Mult Living   2 1 1  1  1   1      Review of Systems  Constitutional: Negative for fever and activity change.       All ROS Neg except as noted in HPI  HENT: Negative for nosebleeds.   Eyes: Negative for photophobia and discharge.  Respiratory: Negative for  cough, shortness of breath and wheezing.   Cardiovascular: Negative for chest pain and palpitations.  Gastrointestinal: Negative for vomiting, abdominal pain and blood in stool.  Genitourinary: Negative for dysuria, frequency and hematuria.  Musculoskeletal: Negative for arthralgias, back pain and neck pain.  Skin: Negative.   Neurological: Positive for dizziness. Negative for seizures and speech difficulty.  Psychiatric/Behavioral: Negative for hallucinations and confusion.    Allergies  Dairy aid and Meat extract  Home Medications   Current Outpatient Rx  Name  Route  Sig  Dispense  Refill  . azelastine (ASTELIN) 137 MCG/SPRAY nasal spray   Each Nare   Place 2 sprays into both nostrils 2 (two) times daily.         . fluticasone (FLONASE) 50 MCG/ACT nasal spray   Each Nare   Place 2 sprays into both nostrils daily.         . hydrOXYzine (ATARAX/VISTARIL) 25 MG tablet   Oral   Take 50 mg by mouth at bedtime.         . montelukast (SINGULAIR) 10 MG tablet   Oral   Take 1 tablet by mouth 2 (two) times daily.         Marland Kitchen. omeprazole (PRILOSEC) 20 MG capsule   Oral   Take 20 mg by mouth 2 (two) times daily.          . ranitidine (ZANTAC) 150 MG tablet   Oral   Take  150 mg by mouth at bedtime.          BP 130/61  Pulse 62  Temp(Src) 98 F (36.7 C) (Oral)  Resp 18  Ht 5\' 4"  (1.626 m)  Wt 220 lb (99.791 kg)  BMI 37.74 kg/m2  SpO2 99% Physical Exam  Nursing note and vitals reviewed. Constitutional: She is oriented to person, place, and time. She appears well-developed and well-nourished.  Non-toxic appearance.  HENT:  Head: Normocephalic.  Right Ear: Tympanic membrane and external ear normal.  Left Ear: Tympanic membrane and external ear normal.  Eyes: EOM and lids are normal. Pupils are equal, round, and reactive to light.  Neck: Normal range of motion. Neck supple. Carotid bruit is not present.  Cardiovascular: Normal rate, regular rhythm, normal heart  sounds, intact distal pulses and normal pulses.   Pulmonary/Chest: Breath sounds normal. No respiratory distress.  Abdominal: Soft. Bowel sounds are normal. There is no tenderness. There is no guarding.  No CVAT.  Musculoskeletal: Normal range of motion.  Lymphadenopathy:       Head (right side): No submandibular adenopathy present.       Head (left side): No submandibular adenopathy present.    She has no cervical adenopathy.  Neurological: She is alert and oriented to person, place, and time. She has normal strength. No cranial nerve deficit or sensory deficit.  Skin: Skin is warm and dry.  Psychiatric: She has a normal mood and affect. Her speech is normal.    ED Course  Procedures (including critical care time) Labs Review Labs Reviewed  URINALYSIS, ROUTINE W REFLEX MICROSCOPIC - Abnormal; Notable for the following:    Hgb urine dipstick TRACE (*)    Leukocytes, UA SMALL (*)    All other components within normal limits  URINE MICROSCOPIC-ADD ON - Abnormal; Notable for the following:    Squamous Epithelial / LPF MANY (*)    Bacteria, UA MANY (*)    All other components within normal limits  PREGNANCY, URINE   Imaging Review No results found.  EKG Interpretation   None       MDM  No diagnosis found. **I have reviewed nursing notes, vital signs, and all appropriate lab and imaging results for this patient.* UA reveals trace hgb, small leukocytes. Microscopic exam reveals 0-2 wbc and 3-6 rbc with many bacteria. O/w wnl. Pt discribes vaginal irritation. Suspect dysuria is related to irritation related to vaginitis. Pt treated with diflucan. She will try tepid baths, and gynelotrimin if not improving. Pt to follow up at the health dept if not improving.   Kathie Dike, PA-C 04/05/13 1212

## 2013-04-09 NOTE — ED Provider Notes (Signed)
Medical screening examination/treatment/procedure(s) were performed by non-physician practitioner and as supervising physician I was immediately available for consultation/collaboration.  EKG Interpretation   None       Devoria AlbeIva Destiney Sanabia, MD, Armando GangFACEP   Ward GivensIva L Keiji Melland, MD 04/09/13 [phone]

## 2014-01-13 ENCOUNTER — Encounter (HOSPITAL_COMMUNITY): Payer: Self-pay | Admitting: Emergency Medicine

## 2015-05-15 ENCOUNTER — Emergency Department (HOSPITAL_COMMUNITY): Payer: Medicaid - Out of State

## 2015-05-15 ENCOUNTER — Emergency Department (HOSPITAL_COMMUNITY)
Admission: EM | Admit: 2015-05-15 | Discharge: 2015-05-15 | Disposition: A | Discharge status: Home / Self Care | Payer: Medicaid - Out of State | Attending: Emergency Medicine | Admitting: Emergency Medicine

## 2015-05-15 ENCOUNTER — Encounter (HOSPITAL_COMMUNITY): Payer: Self-pay

## 2015-05-15 DIAGNOSIS — Y929 Unspecified place or not applicable: Secondary | ICD-10-CM | POA: Diagnosis not present

## 2015-05-15 DIAGNOSIS — Y999 Unspecified external cause status: Secondary | ICD-10-CM | POA: Diagnosis not present

## 2015-05-15 DIAGNOSIS — Y939 Activity, unspecified: Secondary | ICD-10-CM | POA: Diagnosis not present

## 2015-05-15 DIAGNOSIS — X58XXXA Exposure to other specified factors, initial encounter: Secondary | ICD-10-CM | POA: Insufficient documentation

## 2015-05-15 DIAGNOSIS — Z79899 Other long term (current) drug therapy: Secondary | ICD-10-CM | POA: Insufficient documentation

## 2015-05-15 DIAGNOSIS — S76211A Strain of adductor muscle, fascia and tendon of right thigh, initial encounter: Secondary | ICD-10-CM | POA: Insufficient documentation

## 2015-05-15 DIAGNOSIS — Z87891 Personal history of nicotine dependence: Secondary | ICD-10-CM | POA: Diagnosis not present

## 2015-05-15 DIAGNOSIS — M25559 Pain in unspecified hip: Secondary | ICD-10-CM

## 2015-05-15 DIAGNOSIS — M25551 Pain in right hip: Secondary | ICD-10-CM | POA: Insufficient documentation

## 2015-05-15 DIAGNOSIS — M545 Low back pain: Secondary | ICD-10-CM | POA: Diagnosis present

## 2015-05-15 DIAGNOSIS — T148XXA Other injury of unspecified body region, initial encounter: Secondary | ICD-10-CM

## 2015-05-15 LAB — POC URINE PREG, ED: Preg Test, Ur: NEGATIVE

## 2015-05-15 MED ORDER — METHOCARBAMOL 500 MG PO TABS: 500.0000 mg | ORAL_TABLET | Freq: Three times a day (TID) | ORAL | Status: DC

## 2015-05-15 MED ORDER — ACETAMINOPHEN-CODEINE #3 300-30 MG PO TABS: 1.0000 | ORAL_TABLET | Freq: Four times a day (QID) | ORAL | Status: DC | PRN

## 2015-05-15 MED ORDER — DIAZEPAM 5 MG PO TABS
5.0000 mg | ORAL_TABLET | Freq: Once | ORAL | Status: AC
  Administered 2015-05-15: 5 mg via ORAL
  Filled 2015-05-15: qty 1

## 2015-05-15 MED ORDER — ACETAMINOPHEN-CODEINE #3 300-30 MG PO TABS
2.0000 | ORAL_TABLET | Freq: Once | ORAL | Status: AC
  Administered 2015-05-15: 1 via ORAL
  Filled 2015-05-15: qty 2

## 2015-05-15 NOTE — ED Provider Notes (Signed)
CSN: [id number]     Arrival date & time 05/15/15  2107 History   First MD Initiated Contact with Patient 05/15/15 2122     Chief Complaint  Patient presents with  . Leg Pain     (Consider location/radiation/quality/duration/timing/severity/associated sxs/prior Treatment) Patient is a 35 y.o. female presenting with back pain. The history is provided by the patient.  Back Pain Location:  Lumbar spine Quality:  Aching Radiates to:  L thigh and R foot Pain severity:  Moderate Pain is:  Same all the time Onset quality:  Gradual Timing:  Intermittent Progression:  Worsening Context: not falling, not jumping from heights, not occupational injury and not recent injury   Relieved by:  Nothing Worsened by:  Movement Associated symptoms: leg pain   Associated symptoms: no bladder incontinence, no bowel incontinence and no perianal numbness   Risk factors: no hx of cancer, no hx of osteoporosis and no vascular disease     Past Medical History  Diagnosis Date  . Vertigo   . Reflux    Past Surgical History  Procedure Laterality Date  . Cesarean section     Family History  Problem Relation Age of Onset  . Cancer Other   . Diabetes Other    Social History  Substance Use Topics  . Smoking status: Former Smoker -- 0.50 packs/day for 3 years    Types: Cigarettes    Quit date: 03/14/1990  . Smokeless tobacco: Never Used  . Alcohol Use: No   OB History    Gravida Para Term Preterm AB TAB SAB Ectopic Multiple Living   2 1 1  1  1   1      Review of Systems  Gastrointestinal: Negative for bowel incontinence.  Genitourinary: Negative for bladder incontinence.  Musculoskeletal: Positive for back pain.  Neurological: Positive for dizziness.  All other systems reviewed and are negative.     Allergies  Dairy aid and Meat extract  Home Medications   Prior to Admission medications   Medication Sig Start Date End Date Taking? Authorizing Provider  azelastine (ASTELIN) 137  MCG/SPRAY nasal spray Place 2 sprays into both nostrils 2 (two) times daily. 03/15/13   Historical Provider, MD  fluticasone (FLONASE) 50 MCG/ACT nasal spray Place 2 sprays into both nostrils daily. 03/15/13   Historical Provider, MD  hydrOXYzine (ATARAX/VISTARIL) 25 MG tablet Take 50 mg by mouth at bedtime.    Historical Provider, MD  montelukast (SINGULAIR) 10 MG tablet Take 1 tablet by mouth 2 (two) times daily. 03/05/13   Historical Provider, MD  omeprazole (PRILOSEC) 20 MG capsule Take 20 mg by mouth 2 (two) times daily.     Historical Provider, MD  ranitidine (ZANTAC) 150 MG tablet Take 150 mg by mouth at bedtime.    Historical Provider, MD   BP 149/82 mmHg  Pulse 86  Temp(Src) 98.5 F (36.9 C) (Oral)  Resp 16  Ht 5\' 4"  (1.626 m)  Wt 102.059 kg  BMI 38.60 kg/m2  SpO2 97%  LMP  (LMP Unknown) Physical Exam  Constitutional: She is oriented to person, place, and time. She appears well-developed and well-nourished.  Non-toxic appearance.  HENT:  Head: Normocephalic.  Right Ear: Tympanic membrane and external ear normal.  Left Ear: Tympanic membrane and external ear normal.  Eyes: EOM and lids are normal. Pupils are equal, round, and reactive to light.  Neck: Normal range of motion. Neck supple. Carotid bruit is not present.  Cardiovascular: Normal rate, regular rhythm, normal heart sounds, intact distal  pulses and normal pulses.   Pulmonary/Chest: Breath sounds normal. No respiratory distress.  Abdominal: Soft. Bowel sounds are normal. There is no tenderness. There is no guarding.  Musculoskeletal: Normal range of motion.       Right hip: She exhibits tenderness. She exhibits no deformity.       Lumbar back: She exhibits normal range of motion, no tenderness, no deformity and no pain.       Legs: There is good range of motion of the right hip, right knee, and right ankle. There is a negative Homans sign. Is no increased redness of the leg. No hot joints appreciated. No noted swelling of  the right lower extremity. The dorsalis pedis pulses 2+ bilaterally.  Lymphadenopathy:       Head (right side): No submandibular adenopathy present.       Head (left side): No submandibular adenopathy present.    She has no cervical adenopathy.  Neurological: She is alert and oriented to person, place, and time. She has normal strength. No cranial nerve deficit or sensory deficit.  Skin: Skin is warm and dry.  Psychiatric: She has a normal mood and affect. Her speech is normal.  Nursing note and vitals reviewed.   ED Course  Procedures (including critical care time) Labs Review Labs Reviewed - No data to display  Imaging Review No results found. I have personally reviewed and evaluated these images and lab results as part of my medical decision-making.   EKG Interpretation None      MDM  X-ray of the lumbar spine, hip and pelvis are all negative. The examination favors a muscle strain, or musculoskeletal pain. Patient advised to use warm tub soaks. Prescription for Robaxin and Tylenol codeine given to the patient. The patient is to follow-up with Dr. Romeo Apple for orthopedic evaluation if not improving.    Final diagnoses:  Hip pain  Muscle strain    *I have reviewed nursing notes, vital signs, and all appropriate lab and imaging results for this patient.[address] PA-C 05/15/15 8119  Vanetta Mulders, MD 05/16/15 [phone]

## 2015-05-15 NOTE — ED Notes (Signed)
Hurting in lower back, down my right leg, and into my toes. No known injury.

## 2015-05-15 NOTE — ED Notes (Signed)
Pt verbalized understanding of no driving and to use caution within 4 hours of taking pain meds due to meds cause drowsiness 

## 2015-05-15 NOTE — Discharge Instructions (Signed)
Musculoskeletal Pain YOUR VITAL SIGNS ARE WITHIN NORMAL LIMITS. YOUR XRAYS ARE NEGTIVE. PLEASE USE WARM TUB SOAKS TO THE RIGHT THIGH. USE ROBAXIN AND IBUPROFEN FOR PAIN. USE TYLENOL CODEINE FOR MORE SEVERE PAIN. SEE DR HARRISON FOR ORTHOPEDIC EVALUATION IF NOT IMPROVING.                                                                                                                                    Musculoskeletal pain is muscle and boney aches and pains. These pains can occur in any part of the body. Your caregiver may treat you without knowing the cause of the pain. They may treat you if blood or urine tests, X-rays, and other tests were normal.  CAUSES There is often not a definite cause or reason for these pains. These pains may be caused by a type of germ (virus). The discomfort may also come from overuse. Overuse includes working out too hard when your body is not fit. Boney aches also come from weather changes. Bone is sensitive to atmospheric pressure changes. HOME CARE INSTRUCTIONS   Ask when your test results will be ready. Make sure you get your test results.  Only take over-the-counter or prescription medicines for pain, discomfort, or fever as directed by your caregiver. If you were given medications for your condition, do not drive, operate machinery or power tools, or sign legal documents for 24 hours. Do not drink alcohol. Do not take sleeping pills or other medications that may interfere with treatment.  Continue all activities unless the activities cause more pain. When the pain lessens, slowly resume normal activities. Gradually increase the intensity and duration of the activities or exercise.  During periods of severe pain, bed rest may be helpful. Lay or sit in any position that is comfortable.  Putting ice on the injured area.  Put ice in a bag.  Place a towel between your skin and the bag.  Leave the ice on for 15 to 20 minutes, 3 to 4 times a day.  Follow up with your  caregiver for continued problems and no reason can be found for the pain. If the pain becomes worse or does not go away, it may be necessary to repeat tests or do additional testing. Your caregiver may need to look further for a possible cause. SEEK IMMEDIATE MEDICAL CARE IF:  You have pain that is getting worse and is not relieved by medications.  You develop chest pain that is associated with shortness or breath, sweating, feeling sick to your stomach (nauseous), or throw up (vomit).  Your pain becomes localized to the abdomen.  You develop any new symptoms that seem different or that concern you. MAKE SURE YOU:   Understand these instructions.  Will watch your condition.  Will get help right away if you are not doing well or get worse.   This information is not intended to replace advice given to you by  your health care provider. Make sure you discuss any questions you have with your health care provider.   Document Released: 02/28/2005 Document Revised: 05/23/2011 Document Reviewed: 11/02/2012 Elsevier Interactive Patient Education [employer]2016 Elsevier Inc.

## 2015-06-25 ENCOUNTER — Encounter (HOSPITAL_COMMUNITY): Payer: Self-pay

## 2015-06-25 ENCOUNTER — Emergency Department (HOSPITAL_COMMUNITY)
Admission: EM | Admit: 2015-06-25 | Discharge: 2015-06-25 | Disposition: A | Discharge status: Home / Self Care | Payer: Medicaid - Out of State | Attending: Emergency Medicine | Admitting: Emergency Medicine

## 2015-06-25 DIAGNOSIS — Z79899 Other long term (current) drug therapy: Secondary | ICD-10-CM | POA: Diagnosis not present

## 2015-06-25 DIAGNOSIS — Z87891 Personal history of nicotine dependence: Secondary | ICD-10-CM | POA: Diagnosis not present

## 2015-06-25 DIAGNOSIS — R112 Nausea with vomiting, unspecified: Secondary | ICD-10-CM | POA: Diagnosis not present

## 2015-06-25 DIAGNOSIS — K5900 Constipation, unspecified: Secondary | ICD-10-CM | POA: Diagnosis present

## 2015-06-25 MED ORDER — ONDANSETRON 4 MG PO TBDP: 4.0000 mg | ORAL_TABLET | Freq: Three times a day (TID) | ORAL | Status: DC | PRN

## 2015-06-25 MED ORDER — DOCUSATE SODIUM 100 MG PO CAPS: 100.0000 mg | ORAL_CAPSULE | Freq: Two times a day (BID) | ORAL | Status: DC

## 2015-06-25 MED ORDER — DICYCLOMINE HCL 20 MG PO TABS: 20.0000 mg | ORAL_TABLET | Freq: Three times a day (TID) | ORAL | Status: DC

## 2015-06-25 NOTE — ED Notes (Signed)
Pt states history of constipation. States medication her PCP had her on not working. Pt states constipation has never been this bad. Pt denies any urinary problems.

## 2015-06-25 NOTE — ED Provider Notes (Signed)
By signing my name below, I, Tanda RockersMargaux Venter, attest that this documentation has been prepared under the direction and in the presence of Enbridge EnergyKristen N Ward, DO. Electronically Signed: Tanda RockersMargaux Venter, ED Scribe. 06/25/2015. 11:18 PM.  TIME SEEN: 11:11 PM  CHIEF COMPLAINT: Constipation  HPI:  Angela Harding is a 35 y.o. female who presents to the Emergency Department complaining of gradual onset, constant, constipation x 2 weeks. Pt states that she has been passing small amounts of "fatty" stool but has not had a regular bowel movement. She has had issues with constipation in the past but it has never been this severe. Pt also complains of nausea and vomiting. The last time she vomited was 1 PM today (approximately 10 hours ago). Pt has hx of IBS and takes Linzess for it which is prescribed by her GI, Dr. Allena KatzPatel, in [place]. She has been taking this as well as Miralax once per day, Citrate, enemas, and suppositories without relief. Denies fever, chills, or any other associated symptoms.   S/p c section.  No h/o SBO.    ROS: See HPI Constitutional: no fever  Eyes: no drainage  ENT: no runny nose   Cardiovascular:  no chest pain  Resp: no SOB  GI: nausea, vomiting, and constipation GU: no dysuria Integumentary: no rash  Allergy: no hives  Musculoskeletal: no leg swelling  Neurological: no slurred speech ROS otherwise negative  PAST MEDICAL HISTORY/PAST SURGICAL HISTORY:  Past Medical History  Diagnosis Date  . Vertigo   . Reflux     MEDICATIONS:  Prior to Admission medications   Medication Sig Start Date End Date Taking? Authorizing Provider  acetaminophen (TYLENOL) 500 MG tablet Take 500-1,000 mg by mouth every 6 (six) hours as needed for mild pain or moderate pain.    Historical Provider, MD  acetaminophen-codeine (TYLENOL #3) 300-30 MG tablet Take 1-2 tablets by mouth every 6 (six) hours as needed for moderate pain. 05/15/15   Ivery QualeHobson Bryant, PA-C  azelastine (ASTELIN) 137 MCG/SPRAY  nasal spray Place 2 sprays into both nostrils 2 (two) times daily. 03/15/13   Historical Provider, MD  fluticasone (FLONASE) 50 MCG/ACT nasal spray Place 2 sprays into both nostrils daily. 03/15/13   Historical Provider, MD  methocarbamol (ROBAXIN) 500 MG tablet Take 1 tablet (500 mg total) by mouth 3 (three) times daily. 05/15/15   Ivery QualeHobson Bryant, PA-C  montelukast (SINGULAIR) 10 MG tablet Take 1 tablet by mouth daily.  03/05/13   Historical Provider, MD  ranitidine (ZANTAC) 150 MG tablet Take 150 mg by mouth at bedtime.    Historical Provider, MD    ALLERGIES:  Allergies  Allergen Reactions  . Dairy Aid [Lactase]   . Meat Extract     SOCIAL HISTORY:  Social History  Substance Use Topics  . Smoking status: Former Smoker -- 0.50 packs/day for 3 years    Types: Cigarettes    Quit date: 03/14/1990  . Smokeless tobacco: Never Used  . Alcohol Use: No    FAMILY HISTORY: Family History  Problem Relation Age of Onset  . Cancer Other   . Diabetes Other     EXAM: BP 151/82 mmHg  Pulse 67  Temp(Src) 98.1 F (36.7 C) (Oral)  Resp 20  Ht 5\' 4"  (1.626 m)  Wt 230 lb (104.327 kg)  BMI 39.46 kg/m2  SpO2 98%  LMP 04/27/2015 CONSTITUTIONAL: Alert and oriented and responds appropriately to questions. Well-appearing; well-nourished; Febrile, nontoxic HEAD: Normocephalic EYES: Conjunctivae clear, PERRL ENT: normal nose; no rhinorrhea; moist mucous  membranes NECK: Supple, no meningismus, no LAD  CARD: RRR; S1 and S2 appreciated; no murmurs, no clicks, no rubs, no gallops RESP: Normal chest excursion without splinting or tachypnea; breath sounds clear and equal bilaterally; no wheezes, no rhonchi, no rales, no hypoxia or respiratory distress, speaking full sentences ABD/GI: Normal bowel sounds; non-distended; soft, non-tender, no rebound, no guarding, no peritoneal signs, no tympany or fluid wave, no tenderness at the break point, negative Murphy sign, completely benign abdominal exam RECTAL:   Normal rectal tone, no gross blood or melena, guaiac negative, no hemorrhoids appreciated, nontender rectal exam; no fecal impaction, no stool in the rectal vault BACK:  The back appears normal and is non-tender to palpation, there is no CVA tenderness EXT: Normal ROM in all joints; non-tender to palpation; no edema; normal capillary refill; no cyanosis, no calf tenderness or swelling    SKIN: Normal color for age and race; warm; no rash NEURO: Moves all extremities equally, sensation to light touch intact diffusely, cranial nerves II through XII intact PSYCH: The patient's mood and manner are appropriate. Grooming and personal hygiene are appropriate.  MEDICAL DECISION MAKING: Patient here with complaints of constipation. Doubt small bowel obstruction based on her clinical exam. She appears very well-hydrated with nondistended, nontender abdomen.  No fecal impaction on exam. No bloody stool, melena. Have advised her to increase her MiraLAX to 3 times a day and add Colace twice a day. I recommend she add Metamucil or Benefiber to her diet and increase her water intake. We'll give her outpatient follow-up with gastroenterology. Doubt any life-threatening illness at this time. She states she does have intermittent nausea and abdominal cramping. None currently. We'll discharge with prescriptions for Zofran, Bentyl. Have advised her that opiates would make her symptoms worse. Discussed return precautions.   At this time, I do not feel there is any life-threatening condition present. I have reviewed and discussed all results (EKG, imaging, lab, urine as appropriate), exam findings with patient. I have reviewed nursing notes and appropriate previous records.  I feel the patient is safe to be discharged home without further emergent workup. Discussed usual and customary return precautions. Patient and family (if present) verbalize understanding and are comfortable with this plan.  Patient will follow-up with their  primary care provider. If they do not have a primary care provider, information for follow-up has been provided to them. All questions have been answered.  I personally performed the services described in this documentation, which was scribed in my presence. The recorded information has been reviewed and is accurate.    Layla Maw Ward, DO 06/26/15 [phone]

## 2015-06-25 NOTE — Discharge Instructions (Signed)
I recommend that you continue your Linzess.  Please use Metamucil or Benefiber 1 - 2 times a day to help increase your fiber intake.  Increase your water intake as well.  You should be using Miralax three times a day and Colace 100 mg tablets twice a day.  Only stop these medications if you are having diarrhea.  If this is not enough to help you have a bowel movement, you may use Fleet enemas, Dulcolax suppositories, Magnesium citrate as needed.  Please follow up with gastroenterology.   Constipation, Adult Constipation is when a person has fewer than three bowel movements a week, has difficulty having a bowel movement, or has stools that are dry, hard, or larger than normal. As people grow older, constipation is more common. A low-fiber diet, not taking in enough fluids, and taking certain medicines may make constipation worse.  CAUSES   Certain medicines, such as antidepressants, pain medicine, iron supplements, antacids, and water pills.   Certain diseases, such as diabetes, irritable bowel syndrome (IBS), thyroid disease, or depression.   Not drinking enough water.   Not eating enough fiber-rich foods.   Stress or travel.   Lack of physical activity or exercise.   Ignoring the urge to have a bowel movement.   Using laxatives too much.  SIGNS AND SYMPTOMS   Having fewer than three bowel movements a week.   Straining to have a bowel movement.   Having stools that are hard, dry, or larger than normal.   Feeling full or bloated.   Pain in the lower abdomen.   Not feeling relief after having a bowel movement.  DIAGNOSIS  Your health care provider will take a medical history and perform a physical exam. Further testing may be done for severe constipation. Some tests may include:  A barium enema X-ray to examine your rectum, colon, and, sometimes, your small intestine.   A sigmoidoscopy to examine your lower colon.   A colonoscopy to examine your entire  colon. TREATMENT  Treatment will depend on the severity of your constipation and what is causing it. Some dietary treatments include drinking more fluids and eating more fiber-rich foods. Lifestyle treatments may include regular exercise. If these diet and lifestyle recommendations do not help, your health care provider may recommend taking over-the-counter laxative medicines to help you have bowel movements. Prescription medicines may be prescribed if over-the-counter medicines do not work.  HOME CARE INSTRUCTIONS   Eat foods that have a lot of fiber, such as fruits, vegetables, whole grains, and beans.  Limit foods high in fat and processed sugars, such as french fries, hamburgers, cookies, candies, and soda.   A fiber supplement may be added to your diet if you cannot get enough fiber from foods.   Drink enough fluids to keep your urine clear or pale yellow.   Exercise regularly or as directed by your health care provider.   Go to the restroom when you have the urge to go. Do not hold it.   Only take over-the-counter or prescription medicines as directed by your health care provider. Do not take other medicines for constipation without talking to your health care provider first.  SEEK IMMEDIATE MEDICAL CARE IF:   You have bright red blood in your stool.   Your constipation lasts for more than 4 days or gets worse.   You have abdominal or rectal pain.   You have thin, pencil-like stools.   You have unexplained weight loss. MAKE SURE YOU:   Understand  these instructions.  Will watch your condition.  Will get help right away if you are not doing well or get worse.   This information is not intended to replace advice given to you by your health care provider. Make sure you discuss any questions you have with your health care provider.   Document Released: 11/27/2003 Document Revised: 03/21/2014 Document Reviewed: 12/10/2012 Elsevier Interactive Patient Education 2016  Elsevier Inc.  High-Fiber Diet Fiber, also called dietary fiber, is a type of carbohydrate found in fruits, vegetables, whole grains, and beans. A high-fiber diet can have many health benefits. Your health care provider may recommend a high-fiber diet to help:  Prevent constipation. Fiber can make your bowel movements more regular.  Lower your cholesterol.  Relieve hemorrhoids, uncomplicated diverticulosis, or irritable bowel syndrome.  Prevent overeating as part of a weight-loss plan.  Prevent heart disease, type 2 diabetes, and certain cancers. WHAT IS MY PLAN? The recommended daily intake of fiber includes:  38 grams for men under age 14.  30 grams for men over age 57.  25 grams for women under age 59.  21 grams for women over age 76. You can get the recommended daily intake of dietary fiber by eating a variety of fruits, vegetables, grains, and beans. Your health care provider may also recommend a fiber supplement if it is not possible to get enough fiber through your diet. WHAT DO I NEED TO KNOW ABOUT A HIGH-FIBER DIET?  Fiber supplements have not been widely studied for their effectiveness, so it is better to get fiber through food sources.  Always check the fiber content on thenutrition facts label of any prepackaged food. Look for foods that contain at least 5 grams of fiber per serving.  Ask your dietitian if you have questions about specific foods that are related to your condition, especially if those foods are not listed in the following section.  Increase your daily fiber consumption gradually. Increasing your intake of dietary fiber too quickly may cause bloating, cramping, or gas.  Drink plenty of water. Water helps you to digest fiber. WHAT FOODS CAN I EAT? Grains Whole-grain breads. Multigrain cereal. Oats and oatmeal. Brown rice. Barley. Bulgur wheat. Millet. Bran muffins. Popcorn. Rye wafer crackers. Vegetables Sweet potatoes. Spinach. Kale. Artichokes.  Cabbage. Broccoli. Green peas. Carrots. Squash. Fruits Berries. Pears. Apples. Oranges. Avocados. Prunes and raisins. Dried figs. Meats and Other Protein Sources Navy, kidney, pinto, and soy beans. Split peas. Lentils. Nuts and seeds. Dairy Fiber-fortified yogurt. Beverages Fiber-fortified soy milk. Fiber-fortified orange juice. Other Fiber bars. The items listed above may not be a complete list of recommended foods or beverages. Contact your dietitian for more options. WHAT FOODS ARE NOT RECOMMENDED? Grains White bread. Pasta made with refined flour. White rice. Vegetables Fried potatoes. Canned vegetables. Well-cooked vegetables.  Fruits Fruit juice. Cooked, strained fruit. Meats and Other Protein Sources Fatty cuts of meat. Fried Environmental education officer or fried fish. Dairy Milk. Yogurt. Cream cheese. Sour cream. Beverages Soft drinks. Other Cakes and pastries. Butter and oils. The items listed above may not be a complete list of foods and beverages to avoid. Contact your dietitian for more information. WHAT ARE SOME TIPS FOR INCLUDING HIGH-FIBER FOODS IN MY DIET?  Eat a wide variety of high-fiber foods.  Make sure that half of all grains consumed each day are whole grains.  Replace breads and cereals made from refined flour or white flour with whole-grain breads and cereals.  Replace white rice with brown rice, bulgur wheat, or  millet.  Start the day with a breakfast that is high in fiber, such as a cereal that contains at least 5 grams of fiber per serving.  Use beans in place of meat in soups, salads, or pasta.  Eat high-fiber snacks, such as berries, raw vegetables, nuts, or popcorn.   This information is not intended to replace advice given to you by your health care provider. Make sure you discuss any questions you have with your health care provider.   Document Released: 02/28/2005 Document Revised: 03/21/2014 Document Reviewed: 08/13/2013 Elsevier Interactive Patient  Education [employer]

## 2015-06-25 NOTE — ED Notes (Signed)
Pt reports being constipated and only passing small hard balls of stool in the past two weeks. Pt says she has tried laxatives, enemas, suppository's with no relief.

## 2015-06-25 NOTE — ED Notes (Signed)
Pt alert & oriented x4, stable gait. Patient given discharge instructions, paperwork & prescription(s). Patient  instructed to stop at the registration desk to finish any additional paperwork. Patient verbalized understanding. Pt left department w/ no further questions. 

## 2015-10-08 ENCOUNTER — Encounter (HOSPITAL_COMMUNITY): Payer: Self-pay | Admitting: Emergency Medicine

## 2015-10-08 ENCOUNTER — Emergency Department (HOSPITAL_COMMUNITY)
Admission: EM | Admit: 2015-10-08 | Discharge: 2015-10-08 | Disposition: A | Discharge status: Home / Self Care | Payer: BLUE CROSS/BLUE SHIELD | Attending: Emergency Medicine | Admitting: Emergency Medicine

## 2015-10-08 ENCOUNTER — Emergency Department (HOSPITAL_COMMUNITY): Payer: BLUE CROSS/BLUE SHIELD

## 2015-10-08 DIAGNOSIS — Y929 Unspecified place or not applicable: Secondary | ICD-10-CM | POA: Diagnosis not present

## 2015-10-08 DIAGNOSIS — S90112A Contusion of left great toe without damage to nail, initial encounter: Secondary | ICD-10-CM | POA: Diagnosis not present

## 2015-10-08 DIAGNOSIS — W228XXA Striking against or struck by other objects, initial encounter: Secondary | ICD-10-CM | POA: Insufficient documentation

## 2015-10-08 DIAGNOSIS — S90452A Superficial foreign body, left great toe, initial encounter: Secondary | ICD-10-CM | POA: Diagnosis not present

## 2015-10-08 DIAGNOSIS — Y999 Unspecified external cause status: Secondary | ICD-10-CM | POA: Insufficient documentation

## 2015-10-08 DIAGNOSIS — Z23 Encounter for immunization: Secondary | ICD-10-CM | POA: Diagnosis not present

## 2015-10-08 DIAGNOSIS — Y939 Activity, unspecified: Secondary | ICD-10-CM | POA: Diagnosis not present

## 2015-10-08 DIAGNOSIS — Z87891 Personal history of nicotine dependence: Secondary | ICD-10-CM | POA: Insufficient documentation

## 2015-10-08 DIAGNOSIS — S99922A Unspecified injury of left foot, initial encounter: Secondary | ICD-10-CM | POA: Diagnosis present

## 2015-10-08 LAB — POC URINE PREG, ED: Preg Test, Ur: NEGATIVE

## 2015-10-08 MED ORDER — DOXYCYCLINE HYCLATE 100 MG PO CAPS: 100.0000 mg | ORAL_CAPSULE | Freq: Two times a day (BID) | ORAL | 0 refills | Status: DC

## 2015-10-08 MED ORDER — IBUPROFEN 800 MG PO TABS
800.0000 mg | ORAL_TABLET | Freq: Once | ORAL | Status: AC
  Administered 2015-10-08: 800 mg via ORAL
  Filled 2015-10-08: qty 1

## 2015-10-08 MED ORDER — HYDROCODONE-ACETAMINOPHEN 5-325 MG PO TABS: 1.0000 | ORAL_TABLET | ORAL | 0 refills | Status: DC | PRN

## 2015-10-08 MED ORDER — ONDANSETRON HCL 4 MG PO TABS
4.0000 mg | ORAL_TABLET | Freq: Once | ORAL | Status: AC
  Administered 2015-10-08: 4 mg via ORAL
  Filled 2015-10-08: qty 1

## 2015-10-08 MED ORDER — DOXYCYCLINE HYCLATE 100 MG PO TABS
100.0000 mg | ORAL_TABLET | Freq: Once | ORAL | Status: AC
  Administered 2015-10-08: 100 mg via ORAL
  Filled 2015-10-08: qty 1

## 2015-10-08 MED ORDER — TETANUS-DIPHTH-ACELL PERTUSSIS 5-2.5-18.5 LF-MCG/0.5 IM SUSP
0.5000 mL | Freq: Once | INTRAMUSCULAR | Status: AC
  Administered 2015-10-08: 0.5 mL via INTRAMUSCULAR
  Filled 2015-10-08: qty 0.5

## 2015-10-08 MED ORDER — HYDROCODONE-ACETAMINOPHEN 5-325 MG PO TABS
2.0000 | ORAL_TABLET | Freq: Once | ORAL | Status: DC
  Filled 2015-10-08: qty 2

## 2015-10-08 MED ORDER — LIDOCAINE HCL (PF) 1 % IJ SOLN
10.0000 mL | Freq: Once | INTRAMUSCULAR | Status: AC
  Administered 2015-10-08: 5 mL via INTRADERMAL

## 2015-10-08 NOTE — ED Provider Notes (Addendum)
[facility]-EMERGENCY DEPT Provider Note   CSN: [id number] Arrival date & time: 10/08/15  1515  First Provider Contact:  First MD Initiated Contact with Patient 10/08/15 1608        History   Chief Complaint Chief Complaint  Patient presents with  . Toe Injury    HPI KARSTEN VAUGHN is a 35 y.o. female.  Pt is a 34y/o female who injured the left great toe 3 days ago while [place]. She hit her left great toe on a rotten piece of wood and some of the wood went in the toe. She attempted to get it our with tweezers but was unsuccessful. She c/o throbbing pain most of the night and worse this AM. Pt unsure of the date of the last tetanus. She is not diabetic.   The history is provided by the patient.    Past Medical History:  Diagnosis Date  . Reflux   . Vertigo     There are no active problems to display for this patient.   Past Surgical History:  Procedure Laterality Date  . CESAREAN SECTION    . KNEE RECONSTRUCTION      OB History    Gravida Para Term Preterm AB Living   SAB TAB Ectopic Multiple Live Births   1               Home Medications    Prior to Admission medications   Medication Sig Start Date End Date Taking? Authorizing Provider  acetaminophen-codeine (TYLENOL #3) 300-30 MG tablet Take 1-2 tablets by mouth every 6 (six) hours as needed for moderate pain. 05/15/15   Ivery Quale, PA-C  azelastine (ASTELIN) 137 MCG/SPRAY nasal spray Place 2 sprays into both nostrils 2 (two) times daily. 03/15/13   Historical Provider, MD  bisacodyl (DULCOLAX) 10 MG suppository Place 10 mg rectally as needed for moderate constipation.    Historical Provider, MD  bisacodyl (DULCOLAX) 5 MG EC tablet Take 5 mg by mouth daily as needed for mild constipation or moderate constipation.    Historical Provider, MD  dicyclomine (BENTYL) 20 MG tablet Take 1 tablet (20 mg total) by mouth 3 (three) times daily before meals. As needed for abdominal  cramping 06/25/15   Kristen N Ward, DO  docusate sodium (COLACE) 100 MG capsule Take 1 capsule (100 mg total) by mouth every 12 (twelve) hours. 06/25/15   Kristen N Ward, DO  fluticasone (FLONASE) 50 MCG/ACT nasal spray Place 2 sprays into both nostrils daily. 03/15/13   Historical Provider, MD  magnesium citrate SOLN Take 1 Bottle by mouth once.    Historical Provider, MD  methocarbamol (ROBAXIN) 500 MG tablet Take 1 tablet (500 mg total) by mouth 3 (three) times daily. 05/15/15   Ivery Quale, PA-C  mineral oil enema Place 1 enema rectally once.    Historical Provider, MD  montelukast (SINGULAIR) 10 MG tablet Take 1 tablet by mouth at bedtime as needed.  03/05/13   Historical Provider, MD  ondansetron (ZOFRAN ODT) 4 MG disintegrating tablet Take 1 tablet (4 mg total) by mouth every 8 (eight) hours as needed for nausea or vomiting. 06/25/15   Kristen N Ward, DO  ranitidine (ZANTAC) 150 MG tablet Take 150 mg by mouth at bedtime.    Historical Provider, MD    Family History Family History  Problem Relation Age of Onset  . Cancer Other   . Diabetes Other  Social History Social History  Substance Use Topics  . Smoking status: Former Smoker    Packs/day: 0.50    Years: 3.00    Types: Cigarettes    Quit date: 03/14/1990  . Smokeless tobacco: Never Used  . Alcohol use No     Allergies   Dairy aid [lactase] and Meat extract   Review of Systems Review of Systems  Constitutional: Negative for activity change.       All ROS Neg except as noted in HPI  HENT: Negative for nosebleeds.   Eyes: Negative for photophobia and discharge.  Respiratory: Negative for cough, shortness of breath and wheezing.   Cardiovascular: Negative for chest pain and palpitations.  Gastrointestinal: Negative for abdominal pain and blood in stool.  Genitourinary: Negative for dysuria, frequency and hematuria.  Musculoskeletal: Negative for arthralgias, back pain and neck pain.  Skin: Negative.   Neurological:  Negative for dizziness, seizures and speech difficulty.  Psychiatric/Behavioral: Negative for confusion and hallucinations.     Physical Exam Updated Vital Signs BP 137/68 (BP Location: Left Arm)   Pulse 64   Temp 98.2 F (36.8 C) (Oral)   Resp 20   Ht 5\' 4"  (1.626 m)   Wt 99.8 kg   LMP 09/24/2015 (Approximate)   SpO2 99%   BMI 37.76 kg/m   Physical Exam  Constitutional: She is oriented to person, place, and time. She appears well-developed and well-nourished.  Non-toxic appearance.  HENT:  Head: Normocephalic.  Right Ear: Tympanic membrane and external ear normal.  Left Ear: Tympanic membrane and external ear normal.  Eyes: EOM and lids are normal. Pupils are equal, round, and reactive to light.  Neck: Normal range of motion. Neck supple. Carotid bruit is not present.  Cardiovascular: Normal rate, regular rhythm, normal heart sounds, intact distal pulses and normal pulses.   Pulmonary/Chest: Breath sounds normal. No respiratory distress.  Abdominal: Soft. Bowel sounds are normal. There is no tenderness. There is no guarding.  Musculoskeletal:  There is increase redness and swelling of the left great toe. The toe is tender to palpation or movement. A small portion of the nail is missing. The edge of the fb can be visualized. No red streaks noted. No lesion at the web space or plantar surface. DP 2+.   Lymphadenopathy:       Head (right side): No submandibular adenopathy present.       Head (left side): No submandibular adenopathy present.    She has no cervical adenopathy.  Neurological: She is alert and oriented to person, place, and time. She has normal strength. No cranial nerve deficit or sensory deficit.  Skin: Skin is warm and dry.  Psychiatric: She has a normal mood and affect. Her speech is normal.  Nursing note and vitals reviewed.    ED Treatments / Results  Labs (all labs ordered are listed, but only abnormal results are displayed) Labs Reviewed - No data to  display  EKG  EKG Interpretation None       Radiology No results found.  Procedures .Foreign Body Removal Date/Time: 10/08/2015 5:22 PM Performed by: Ivery Quale Authorized by: Ivery Quale  Consent: Verbal consent obtained. Risks and benefits: risks, benefits and alternatives were discussed Consent given by: patient Patient understanding: patient states understanding of the procedure being performed Patient identity confirmed: arm band Time out: Immediately prior to procedure a "time out" was called to verify the correct patient, procedure, equipment, support staff and site/side marked as required. Intake: left great toe. Anesthesia:  digital block  Anesthesia: Local Anesthetic: lidocaine 1% without epinephrine Anesthetic total: 4 mL  Sedation: Patient sedated: no Patient restrained: no Complexity: simple 2 objects recovered. Objects recovered: wood splinters under toe nail Post-procedure assessment: foreign body removed Patient tolerance: Patient tolerated the procedure well with no immediate complications   (including critical care time)  Medications Ordered in ED Medications  lidocaine (PF) (XYLOCAINE) 1 % injection 10 mL (not administered)     Initial Impression / Assessment and Plan / ED Course  I have reviewed the triage vital signs and the nursing notes.  Pertinent labs & imaging results that were available during my care of the patient were reviewed by me and considered in my medical decision making (see chart for details).  Clinical Course    **I have reviewed nursing notes, vital signs, and all appropriate lab and imaging results for this patient.*  Final Clinical Impressions(s) / ED Diagnoses  Vital signs stable. Xray of the left great toe is negative for fx, dislocation, or gas. FBs(wood splinters)  removed from the left great toe. Bandage applied. Tetanus updated. Rx for doxycycline and norco given to the patient. Pt will return if  infection not improving. Pt acknowledges understanding of the instructions.   Final diagnoses:  None    New Prescriptions New Prescriptions   No medications on file     Ivery Quale, PA-C 10/08/15 1731    Mancel Bale, MD 10/09/15 [address] PA-C 11/01/15 1610    Mancel Bale, MD 11/02/15 1739

## 2015-10-08 NOTE — Discharge Instructions (Signed)
Please soak the left foot in Epsom salt bath. Please change the bandage daily. Elevate your foot as much as possible. Use doxycycline daily. Use tylenol or ibuprofen for mild pain. Use norco for more severe pain. Return if infection is getting worse.

## 2015-10-08 NOTE — ED Triage Notes (Signed)
[place], causing some bark to lodge under great toe on left foot.  Toe is swollen and red at this time.  Pt has limited ROM.

## 2017-01-13 ENCOUNTER — Emergency Department (HOSPITAL_COMMUNITY): Payer: BLUE CROSS/BLUE SHIELD

## 2017-01-13 ENCOUNTER — Emergency Department (HOSPITAL_COMMUNITY)
Admission: EM | Admit: 2017-01-13 | Discharge: 2017-01-14 | Disposition: A | Discharge status: Home / Self Care | Payer: BLUE CROSS/BLUE SHIELD | Attending: Emergency Medicine | Admitting: Emergency Medicine

## 2017-01-13 ENCOUNTER — Encounter (HOSPITAL_COMMUNITY): Payer: Self-pay | Admitting: Emergency Medicine

## 2017-01-13 DIAGNOSIS — Z87891 Personal history of nicotine dependence: Secondary | ICD-10-CM | POA: Diagnosis not present

## 2017-01-13 DIAGNOSIS — Z79899 Other long term (current) drug therapy: Secondary | ICD-10-CM | POA: Insufficient documentation

## 2017-01-13 DIAGNOSIS — N83201 Unspecified ovarian cyst, right side: Secondary | ICD-10-CM | POA: Diagnosis not present

## 2017-01-13 DIAGNOSIS — R109 Unspecified abdominal pain: Secondary | ICD-10-CM | POA: Diagnosis present

## 2017-01-13 LAB — COMPREHENSIVE METABOLIC PANEL
ALBUMIN: 3.8 g/dL (ref 3.5–5.0)
ALT: 21 U/L (ref 14–54)
ANION GAP: 10 (ref 5–15)
AST: 18 U/L (ref 15–41)
Alkaline Phosphatase: 50 U/L (ref 38–126)
BUN: 20 mg/dL (ref 6–20)
CALCIUM: 9 mg/dL (ref 8.9–10.3)
CHLORIDE: 105 mmol/L (ref 101–111)
CO2: 24 mmol/L (ref 22–32)
Creatinine, Ser: 0.74 mg/dL (ref 0.44–1.00)
GFR calc non Af Amer: >60 mL/min (ref >60)
GLUCOSE: 92 mg/dL (ref 65–99)
POTASSIUM: 3.9 mmol/L (ref 3.5–5.1)
SODIUM: 139 mmol/L (ref 135–145)
Total Bilirubin: 0.2 mg/dL — ABNORMAL LOW (ref 0.3–1.2)
Total Protein: 7.8 g/dL (ref 6.5–8.1)

## 2017-01-13 LAB — URINALYSIS, ROUTINE W REFLEX MICROSCOPIC
BILIRUBIN URINE: NEGATIVE
GLUCOSE, UA: NEGATIVE mg/dL
KETONES UR: NEGATIVE mg/dL
LEUKOCYTES UA: NEGATIVE
Nitrite: NEGATIVE
PH: 5 (ref 5.0–8.0)
PROTEIN: NEGATIVE mg/dL
Specific Gravity, Urine: 1.027 (ref 1.005–1.030)

## 2017-01-13 LAB — CBC
HEMATOCRIT: 39.1 % (ref 36.0–46.0)
HEMOGLOBIN: 13 g/dL (ref 12.0–15.0)
MCH: 31 pg (ref 26.0–34.0)
MCHC: 33.2 g/dL (ref 30.0–36.0)
MCV: 93.1 fL (ref 78.0–100.0)
Platelets: 286 10*3/uL (ref 150–400)
RBC: 4.2 MIL/uL (ref 3.87–5.11)
RDW: 12.8 % (ref 11.5–15.5)
WBC: 14.7 10*3/uL — ABNORMAL HIGH (ref 4.0–10.5)

## 2017-01-13 LAB — PREGNANCY, URINE: PREG TEST UR: NEGATIVE

## 2017-01-13 LAB — LIPASE, BLOOD: Lipase: 26 U/L (ref 11–51)

## 2017-01-13 MED ORDER — ONDANSETRON HCL 4 MG/2ML IJ SOLN
4.0000 mg | Freq: Once | INTRAMUSCULAR | Status: AC
  Administered 2017-01-13: 4 mg via INTRAVENOUS
  Filled 2017-01-13: qty 2

## 2017-01-13 MED ORDER — MORPHINE SULFATE (PF) 4 MG/ML IV SOLN
4.0000 mg | Freq: Once | INTRAVENOUS | Status: DC
  Filled 2017-01-13: qty 1

## 2017-01-13 MED ORDER — SODIUM CHLORIDE 0.9 % IV BOLUS (SEPSIS)
1000.0000 mL | Freq: Once | INTRAVENOUS | Status: AC
  Administered 2017-01-13: 1000 mL via INTRAVENOUS

## 2017-01-13 MED ORDER — MORPHINE SULFATE (PF) 4 MG/ML IV SOLN
INTRAVENOUS | Status: AC
  Filled 2017-01-13: qty 1

## 2017-01-13 NOTE — ED Notes (Signed)
ED Provider at bedside. 

## 2017-01-13 NOTE — ED Triage Notes (Signed)
Patient c/o right flank pain that started suddenly at 1pm. Per patient pain is now radiating into right lower abd/suprapubic. Patient states she has not voided since pain started. Denies hx of kidney stones.

## 2017-01-13 NOTE — ED Notes (Signed)
Patient transported to CT 

## 2017-01-13 NOTE — ED Notes (Signed)
Reassurance, pt continues to report she cannot urinate-  Encouraged to wait

## 2017-01-13 NOTE — ED Triage Notes (Signed)
At work, sudden onset of R flank pain at 1300  Now with pain to her R flank and lower abd area

## 2017-01-13 NOTE — ED Provider Notes (Signed)
[facility] EMERGENCY DEPARTMENT Provider Note   CSN: [id number] Arrival date & time: 01/13/17  1758     History   Chief Complaint Chief Complaint  Patient presents with  . Flank Pain    HPI Angela Harding is a 36 y.o. female.  HPI Patient presents to the emergency room for evaluation of sharp right-sided flank pain that started about 1 PM today.  Patient states it is in the right side of the periumbilical region.  Pain persisted throughout the day while she is at work and did not get any better so she came to the emergency room for evaluation.  She denies any trouble with nausea or vomiting.  No dysuria.  No vaginal discharge or bleeding.  She has had normal menstrual periods Past Medical History:  Diagnosis Date  . Reflux   . Vertigo     There are no active problems to display for this patient.   Past Surgical History:  Procedure Laterality Date  . CESAREAN SECTION    . KNEE RECONSTRUCTION      OB History    Gravida Para Term Preterm AB Living   2 1 1   1 1    SAB TAB Ectopic Multiple Live Births   1               Home Medications    Prior to Admission medications   Medication Sig Start Date End Date Taking? Authorizing Provider  fexofenadine (ALLEGRA) 180 MG tablet Take 180 mg by mouth daily.   Yes [provider]  naproxen sodium (ALEVE) 220 MG tablet Take 440 mg by mouth daily as needed.   Yes [provider]  naproxen (NAPROSYN) 500 MG tablet Take 1 tablet (500 mg total) by mouth 2 (two) times daily. 01/14/17   Linwood DibblesKnapp, Castulo Scarpelli, MD    Family History Family History  Problem Relation Age of Onset  . Cancer Other   . Diabetes Other     Social History Social History  Substance Use Topics  . Smoking status: Former Smoker    Packs/day: 0.50    Years: 3.00    Types: Cigarettes    Quit date: 03/14/1990  . Smokeless tobacco: Never Used  . Alcohol use No     Allergies   Dairy aid [lactase] and Meat extract   Review of Systems Review  of Systems  All other systems reviewed and are negative.    Physical Exam Updated Vital Signs BP 136/90 (BP Location: Right Arm)   Pulse 62   Temp 98.7 F (37.1 C) (Oral)   Resp 18   Ht 1.626 m (5\' 4" )   Wt 102.1 kg (225 lb)   LMP 11/11/2016   SpO2 92%   BMI 38.62 kg/m   Physical Exam  Constitutional: She appears well-developed and well-nourished. No distress.  HENT:  Head: Normocephalic and atraumatic.  Right Ear: External ear normal.  Left Ear: External ear normal.  Eyes: Conjunctivae are normal. Right eye exhibits no discharge. Left eye exhibits no discharge. No scleral icterus.  Neck: Neck supple. No tracheal deviation present.  Cardiovascular: Normal rate, regular rhythm and intact distal pulses.   Pulmonary/Chest: Effort normal and breath sounds normal. No stridor. No respiratory distress. She has no wheezes. She has no rales.  Abdominal: Soft. Bowel sounds are normal. She exhibits no distension. There is tenderness in the periumbilical area. There is no rebound and no guarding. No hernia.    Musculoskeletal: She exhibits no edema or tenderness.  Neurological:  She is alert. She has normal strength. No cranial nerve deficit (no facial droop, extraocular movements intact, no slurred speech) or sensory deficit. She exhibits normal muscle tone. She displays no seizure activity. Coordination normal.  Skin: Skin is warm and dry. No rash noted.  Psychiatric: She has a normal mood and affect.  Nursing note and vitals reviewed.    ED Treatments / Results  Labs (all labs ordered are listed, but only abnormal results are displayed) Labs Reviewed  COMPREHENSIVE METABOLIC PANEL - Abnormal; Notable for the following:       Result Value   Total Bilirubin 0.2 (*)    All other components within normal limits  CBC - Abnormal; Notable for the following:    WBC 14.7 (*)    All other components within normal limits  URINALYSIS, ROUTINE W REFLEX MICROSCOPIC - Abnormal; Notable for  the following:    Hgb urine dipstick LARGE (*)    Bacteria, UA RARE (*)    Squamous Epithelial / LPF 0-5 (*)    All other components within normal limits  PREGNANCY, URINE  LIPASE, BLOOD    EKG  EKG Interpretation None       Radiology Ct Renal Stone Study  Result Date: 01/13/2017 CLINICAL DATA:  Initial evaluation for acute flank pain, stone disease suspected. EXAM: CT ABDOMEN AND PELVIS WITHOUT CONTRAST TECHNIQUE: Multidetector CT imaging of the abdomen and pelvis was performed following the standard protocol without IV contrast. COMPARISON:  Prior CT from 10/30/2012. FINDINGS: Lower chest: Visualized lung bases are clear. Hepatobiliary: Limited noncontrast evaluation of the liver is unremarkable. Gallbladder within normal limits. No biliary dilatation. Pancreas: Pancreas within normal limits. Spleen: Spleen within normal limits. Adrenals/Urinary Tract: Adrenal glands are normal. Kidneys equal in size without evidence for nephrolithiasis or hydronephrosis. No radiopaque calculi seen along the course of the right renal collecting system. No hydroureter. Partially distended bladder within normal limits. No layering stones within the bladder lumen. Stomach/Bowel: Stomach within normal limits. No evidence for bowel obstruction. Appendix is normal. No acute inflammatory changes seen about the bowels. Vascular/Lymphatic: Intra-abdominal aorta of normal caliber. No adenopathy. Reproductive: Uterus and left ovary unremarkable. 4.1 cm right adnexal cyst, likely an ovarian cyst. Other: No free air or fluid. Small fat containing paraumbilical hernia noted. Musculoskeletal: No acute osseous abnormality. No worrisome lytic or blastic osseous lesions. IMPRESSION: 1. No CT evidence for nephrolithiasis or obstructive uropathy. 2. 4.1 cm right adnexal cyst, most likely an ovarian cyst. This is almost certainly benign given size and patient age. No follow-up imaging recommended regarding this lesion. 3. Small fat  containing paraumbilical hernia. 4. No other acute intra-abdominal or pelvic process. Electronically Signed   By: Rise Mu M.D.   On: 01/13/2017 23:52    Procedures Procedures (including critical care time)  Medications Ordered in ED Medications  ondansetron [facility]) injection 4 mg (4 mg Intravenous Given 01/13/17 2224)  sodium chloride 0.9 % bolus 1,000 mL (0 mLs Intravenous Stopped 01/13/17 2258)     Initial Impression / Assessment and Plan / ED Course  I have reviewed the triage vital signs and the nursing notes.  Pertinent labs & imaging results that were available during my care of the patient were reviewed by me and considered in my medical decision making (see chart for details).   Patient presented to the emergency room with right lower quadrant abdominal pain.  Laboratory tests were notable for mild hematuria.  Patient's symptoms were concerning for renal colic.  CT scan however demonstrates  no evidence of ureteral stones however she does have a right adnexal cyst most likely a simple ovarian cyst.  Most likely this is the cause of her symptoms. Patient symptoms improved with treatment. Plan on discharge home with NSAIDs.  Follow-up with primary care doctor.    Final Clinical Impressions(s) / ED Diagnoses   Final diagnoses:  Cyst of right ovary    New Prescriptions Discharge Medication List as of 01/14/2017 12:15 AM    START taking these medications   Details  naproxen (NAPROSYN) 500 MG tablet Take 1 tablet (500 mg total) by mouth 2 (two) times daily., Starting Sat 01/14/2017, Print         Linwood Dibbles, MD 01/14/17 1329

## 2017-01-14 MED ORDER — NAPROXEN 500 MG PO TABS: 500.0000 mg | ORAL_TABLET | Freq: Two times a day (BID) | ORAL | 0 refills | Status: DC

## 2017-01-14 NOTE — Discharge Instructions (Signed)
Take the medications as needed for pain, follow-up with your primary care doctor or GYN doctor in the next week or two to be rechecked if the sx have not resolved

## 2017-01-14 NOTE — ED Notes (Signed)
ED Provider at bedside. 

## 2017-02-18 IMAGING — DX DG TOE GREAT 2+V*L*
3 series · 3 of 3 positions shown · non-contrast
Comparison: None.

CLINICAL DATA: Recent injury, possible foreign body in the left
great toe, swelling and redness

EXAM:
LEFT GREAT TOE

[toe ap]
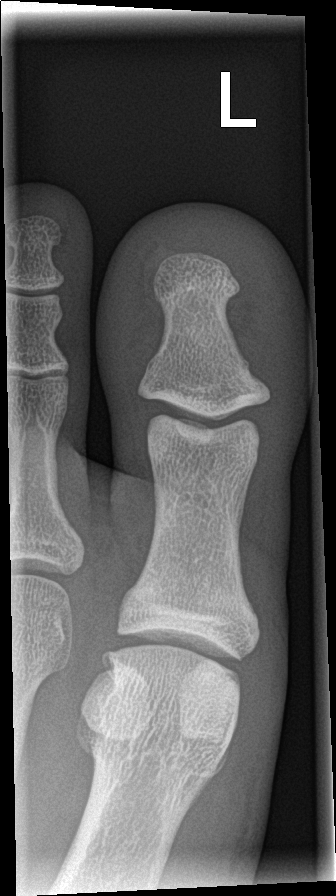

[toe obl]
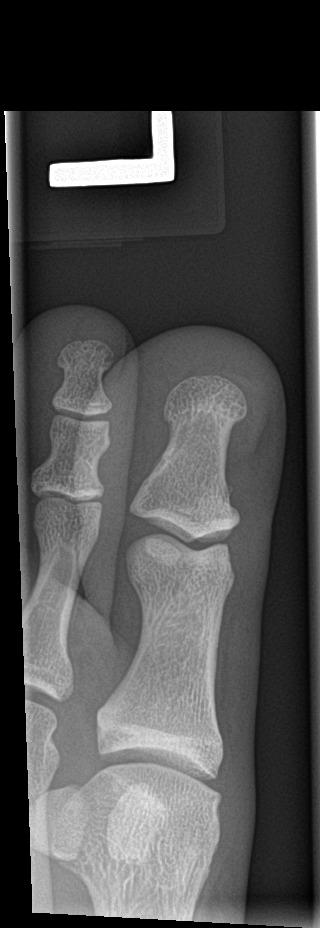

[toe lat]
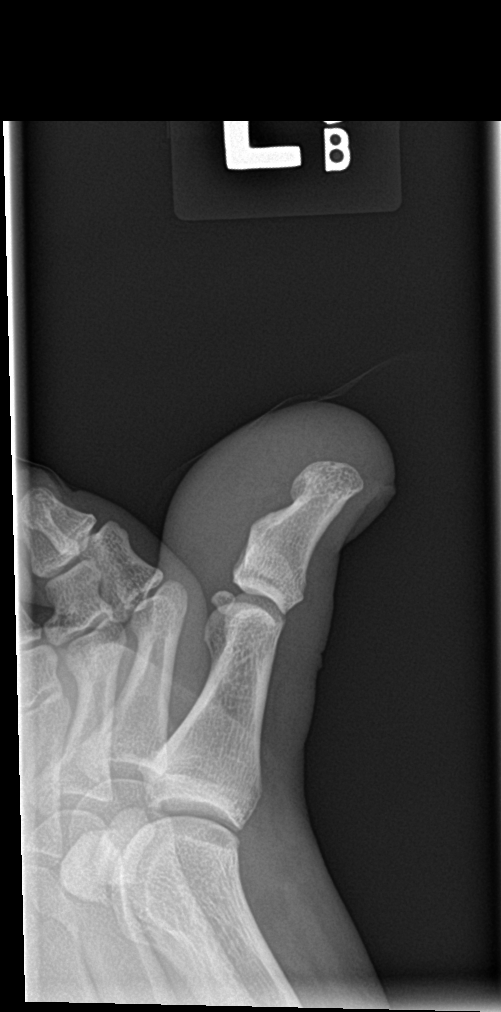

[3 of 3 positions shown; findings below may reference images not displayed]

FINDINGS: There is soft tissue swelling of the distal portion of the left
great toe, but no opaque foreign body is seen. No fracture is noted.
Joint spaces appear normal.
IMPRESSION: Negative.

## 2018-11-16 DIAGNOSIS — J029 Acute pharyngitis, unspecified: Secondary | ICD-10-CM | POA: Diagnosis not present

## 2018-12-31 DIAGNOSIS — J0110 Acute frontal sinusitis, unspecified: Secondary | ICD-10-CM | POA: Diagnosis not present

## 2019-03-18 DIAGNOSIS — M546 Pain in thoracic spine: Secondary | ICD-10-CM | POA: Diagnosis not present

## 2019-03-18 DIAGNOSIS — S134XXA Sprain of ligaments of cervical spine, initial encounter: Secondary | ICD-10-CM | POA: Diagnosis not present

## 2019-03-18 DIAGNOSIS — S338XXA Sprain of other parts of lumbar spine and pelvis, initial encounter: Secondary | ICD-10-CM | POA: Diagnosis not present

## 2019-03-21 DIAGNOSIS — S338XXA Sprain of other parts of lumbar spine and pelvis, initial encounter: Secondary | ICD-10-CM | POA: Diagnosis not present

## 2019-03-21 DIAGNOSIS — M546 Pain in thoracic spine: Secondary | ICD-10-CM | POA: Diagnosis not present

## 2019-03-21 DIAGNOSIS — S134XXA Sprain of ligaments of cervical spine, initial encounter: Secondary | ICD-10-CM | POA: Diagnosis not present

## 2019-04-08 ENCOUNTER — Other Ambulatory Visit: Payer: Self-pay

## 2019-04-08 ENCOUNTER — Ambulatory Visit
Admission: EM | Admit: 2019-04-08 | Discharge: 2019-04-08 | Disposition: A | Discharge status: Home / Self Care | Payer: BC Managed Care – PPO | Attending: Emergency Medicine | Admitting: Emergency Medicine

## 2019-04-08 DIAGNOSIS — Z20822 Contact with and (suspected) exposure to covid-19: Secondary | ICD-10-CM

## 2019-04-08 MED ORDER — BENZONATATE 100 MG PO CAPS: 100.0000 mg | ORAL_CAPSULE | Freq: Three times a day (TID) | ORAL | 0 refills | Status: DC

## 2019-04-08 MED ORDER — FLUTICASONE PROPIONATE 50 MCG/ACT NA SUSP: 1.0000 | Freq: Every day | NASAL | 0 refills | Status: AC

## 2019-04-08 NOTE — Discharge Instructions (Addendum)
COVID testing ordered.  It will take between 2-7 days for test results.  Someone will contact you regarding abnormal results.    In the meantime: You should remain isolated in your home for 10 days from symptom onset AND greater than 72 hours after symptoms resolution (absence of fever without the use of fever-reducing medication and improvement in respiratory symptoms), whichever is longer Get plenty of rest and push fluids Tessalon Perles prescribed for cough Flonase prescribed for nasal congestion and runny nose Use medications daily for symptom relief Use OTC medications like ibuprofen or tylenol as needed fever or pain Call or go to the ED if you have any new or worsening symptoms such as fever, worsening cough, shortness of breath, chest tightness, chest pain, turning blue, changes in mental status, etc...  

## 2019-04-08 NOTE — ED Provider Notes (Signed)
[facility]    CSN: [id number] Arrival date & time: 04/08/19  1107      History   Chief Complaint Chief Complaint  Patient presents with  . Cough    HPI Angela Harding is a 39 y.o. female.   Angela Harding 39 years old female presented to the urgent care for complaint of fatigue, cough for the past 5 days.  Denies sick exposure to COVID, flu or strep.  Denies recent travel.  Denies aggravating or alleviating symptoms.  Denies previous COVID infection.   Denies fever, chills, nasal congestion, rhinorrhea, sore throat, SOB, wheezing, chest pain, nausea, vomiting, changes in bowel or bladder habits.    The history is provided by the patient. No language interpreter was used.    Past Medical History:  Diagnosis Date  . Reflux   . Vertigo     There are no problems to display for this patient.   Past Surgical History:  Procedure Laterality Date  . CESAREAN SECTION    . KNEE RECONSTRUCTION      OB History    Gravida  2   Para  1   Term  1   Preterm      AB  1   Living  1     SAB  1   TAB      Ectopic      Multiple      Live Births               Home Medications    Prior to Admission medications   Medication Sig Start Date End Date Taking? Authorizing Provider  fexofenadine (ALLEGRA) 180 MG tablet Take 180 mg by mouth daily.   Yes [provider]  benzonatate (TESSALON) 100 MG capsule Take 1 capsule (100 mg total) by mouth every 8 (eight) hours. 04/08/19   Brentley Landfair, Darrelyn Hillock, FNP  fluticasone (FLONASE) 50 MCG/ACT nasal spray Place 1 spray into both nostrils daily for 14 days. 04/08/19 04/22/19  Gaynor Genco, Darrelyn Hillock, FNP  naproxen (NAPROSYN) 500 MG tablet Take 1 tablet (500 mg total) by mouth 2 (two) times daily. 01/14/17   Dorie Rank, MD  naproxen sodium (ALEVE) 220 MG tablet Take 440 mg by mouth daily as needed.    [provider]    Family History Family History  Problem Relation Age of Onset  . Cancer Other   . Diabetes  Other   . Healthy Mother   . Healthy Father     Social History Social History   Tobacco Use  . Smoking status: Former Smoker    Packs/day: 0.50    Years: 3.00    Pack years: 1.50    Types: Cigarettes    Quit date: 03/14/1990    Years since quitting: 29.0  . Smokeless tobacco: Never Used  Substance Use Topics  . Alcohol use: No  . Drug use: No     Allergies   Dairy aid [lactase] and Meat extract   Review of Systems Review of Systems  Constitutional: Positive for fatigue.  HENT: Negative.   Respiratory: Positive for cough.   Cardiovascular: Negative.   Gastrointestinal: Negative.   Neurological: Negative.   All other systems reviewed and are negative.    Physical Exam Triage Vital Signs ED Triage Vitals  Enc Vitals Group     BP      Pulse      Resp      Temp      Temp src  SpO2      Weight      Height      Head Circumference      Peak Flow      Pain Score      Pain Loc      Pain Edu?      Excl. in GC?    No data found.  Updated Vital Signs BP (!) 155/90   Pulse 67   Temp 98.3 F (36.8 C)   Resp 18   LMP 02/18/2019   SpO2 95%   Visual Acuity Right Eye Distance:   Left Eye Distance:   Bilateral Distance:    Right Eye Near:   Left Eye Near:    Bilateral Near:     Physical Exam Vitals and nursing note reviewed.  Constitutional:      General: She is not in acute distress.    Appearance: Normal appearance. She is normal weight. She is not ill-appearing or toxic-appearing.  HENT:     Head: Normocephalic.     Right Ear: Tympanic membrane, ear canal and external ear normal. There is no impacted cerumen.     Left Ear: Tympanic membrane, ear canal and external ear normal. There is no impacted cerumen.     Nose: Nose normal. No congestion.     Mouth/Throat:     Mouth: Mucous membranes are moist.     Pharynx: Oropharynx is clear. No oropharyngeal exudate or posterior oropharyngeal erythema.  Cardiovascular:     Rate and Rhythm: Normal  rate and regular rhythm.     Pulses: Normal pulses.     Heart sounds: Normal heart sounds. No murmur.  Pulmonary:     Effort: Pulmonary effort is normal. No respiratory distress.     Breath sounds: Normal breath sounds. No wheezing or rhonchi.  Chest:     Chest wall: No tenderness.  Abdominal:     General: Abdomen is flat. Bowel sounds are normal. There is no distension.     Palpations: There is no mass.     Tenderness: There is no abdominal tenderness.  Skin:    Capillary Refill: Capillary refill takes less than 2 seconds.  Neurological:     General: No focal deficit present.     Mental Status: She is alert and oriented to person, place, and time.      UC Treatments / Results  Labs (all labs ordered are listed, but only abnormal results are displayed) Labs Reviewed  NOVEL CORONAVIRUS, NAA    EKG   Radiology No results found.  Procedures Procedures (including critical care time)  Medications Ordered in UC Medications - No data to display  Initial Impression / Assessment and Plan / UC Course  I have reviewed the triage vital signs and the nursing notes.  Pertinent labs & imaging results that were available during my care of the patient were reviewed by me and considered in my medical decision making (see chart for details).    COVID-19 test was ordered Advised patient to quarantine Tessalon Perles was prescribed for cough Flonase for congestion To go to ED for worsening of symptoms Patient verbalized understanding of the plan of care Final Clinical Impressions(s) / UC Diagnoses   Final diagnoses:  Suspected COVID-19 virus infection     Discharge Instructions     COVID testing ordered.  It will take between 2-7 days for test results.  Someone will contact you regarding abnormal results.    In the meantime: You should remain isolated in your home  for 10 days from symptom onset AND greater than 72 hours after symptoms resolution (absence of fever without  the use of fever-reducing medication and improvement in respiratory symptoms), whichever is longer Get plenty of rest and push fluids Tessalon Perles prescribed for cough Flonase prescribed for nasal congestion and runny nose Use medications daily for symptom relief Use OTC medications like ibuprofen or tylenol as needed fever or pain Call or go to the ED if you have any new or worsening symptoms such as fever, worsening cough, shortness of breath, chest tightness, chest pain, turning blue, changes in mental status, etc...     ED Prescriptions    Medication Sig Dispense Auth. Provider   benzonatate (TESSALON) 100 MG capsule Take 1 capsule (100 mg total) by mouth every 8 (eight) hours. 30 capsule Bethanny Toelle S, FNP   fluticasone (FLONASE) 50 MCG/ACT nasal spray Place 1 spray into both nostrils daily for 14 days. 16 g Durward Parcel, FNP     PDMP not reviewed this encounter.   Durward Parcel, FNP 04/08/19 1139

## 2019-04-08 NOTE — ED Triage Notes (Signed)
Pt presents with fatigue, cough and feeling of being unwell

## 2019-04-09 LAB — NOVEL CORONAVIRUS, NAA: SARS-CoV-2, NAA: NOT DETECTED

## 2019-05-20 DIAGNOSIS — M546 Pain in thoracic spine: Secondary | ICD-10-CM | POA: Diagnosis not present

## 2019-05-20 DIAGNOSIS — S134XXA Sprain of ligaments of cervical spine, initial encounter: Secondary | ICD-10-CM | POA: Diagnosis not present

## 2019-05-20 DIAGNOSIS — S338XXA Sprain of other parts of lumbar spine and pelvis, initial encounter: Secondary | ICD-10-CM | POA: Diagnosis not present

## 2019-06-03 DIAGNOSIS — M546 Pain in thoracic spine: Secondary | ICD-10-CM | POA: Diagnosis not present

## 2019-06-03 DIAGNOSIS — S134XXA Sprain of ligaments of cervical spine, initial encounter: Secondary | ICD-10-CM | POA: Diagnosis not present

## 2019-06-03 DIAGNOSIS — S338XXA Sprain of other parts of lumbar spine and pelvis, initial encounter: Secondary | ICD-10-CM | POA: Diagnosis not present

## 2019-08-20 ENCOUNTER — Ambulatory Visit (INDEPENDENT_AMBULATORY_CARE_PROVIDER_SITE_OTHER): Payer: BC Managed Care – PPO | Admitting: Adult Health

## 2019-08-20 ENCOUNTER — Encounter: Payer: Self-pay | Admitting: Adult Health

## 2019-08-20 VITALS — BP 158/94 | HR 68 | Ht 64.0 in | Wt 278.0 lb

## 2019-08-20 DIAGNOSIS — R030 Elevated blood-pressure reading, without diagnosis of hypertension: Secondary | ICD-10-CM | POA: Diagnosis not present

## 2019-08-20 DIAGNOSIS — N926 Irregular menstruation, unspecified: Secondary | ICD-10-CM

## 2019-08-20 DIAGNOSIS — Z319 Encounter for procreative management, unspecified: Secondary | ICD-10-CM

## 2019-08-20 DIAGNOSIS — Z3202 Encounter for pregnancy test, result negative: Secondary | ICD-10-CM | POA: Diagnosis not present

## 2019-08-20 LAB — POCT URINE PREGNANCY: Preg Test, Ur: NEGATIVE

## 2019-08-20 NOTE — Progress Notes (Signed)
  Subjective:     Patient ID: Angela Harding, female   DOB: 1981-02-25, 39 y.o.   MRN: [id number]  HPI Angela Harding is a 39 year old white female, married, in for UPT. She has missed several periods  And had +then negative test.   Review of Systems +missed periods, had +HPT about 2 1/2 weeks ago then the next week negative test, no bleeding has nausea and cramping  Reviewed past medical,surgical, social and family history. Reviewed medications and allergies.     Objective:   Physical Exam BP (!) 158/94 (BP Location: Right Arm, Patient Position: Sitting, Cuff Size: Large)   Pulse 68   Ht 5\' 4"  (1.626 m)   Wt 278 lb (126.1 kg)   LMP 03/07/2019   BMI 47.72 kg/m UPT is negative, Skin warm and dry. Neck: mid line trachea, normal thyroid, good ROM, no lymphadenopathy noted. Lungs: clear to ausculation bilaterally. Cardiovascular: regular rate and rhythm. AA 0 Fall risk is low PHQ 9 score is 2.     Assessment:     1. Pregnancy examination or test, negative result   2. Missed periods Check QHCG, if negative will rx provera to get withdrawal bleed   3. Elevated BP without diagnosis of hypertension   4. Patient desires pregnancy     Plan:     Follow up in 2 weeks

## 2019-08-21 ENCOUNTER — Other Ambulatory Visit: Payer: Self-pay | Admitting: Adult Health

## 2019-08-21 LAB — BETA HCG QUANT (REF LAB): hCG Quant: <1 m[IU]/mL

## 2019-08-21 MED ORDER — MEDROXYPROGESTERONE ACETATE 10 MG PO TABS: 10.0000 mg | ORAL_TABLET | Freq: Every day | ORAL | 0 refills | Status: DC

## 2019-08-21 NOTE — Progress Notes (Signed)
rx provera 10 mg for 10 days

## 2019-09-03 ENCOUNTER — Other Ambulatory Visit: Payer: Self-pay

## 2019-09-03 ENCOUNTER — Encounter: Payer: Self-pay | Admitting: Adult Health

## 2019-09-03 ENCOUNTER — Ambulatory Visit (INDEPENDENT_AMBULATORY_CARE_PROVIDER_SITE_OTHER): Payer: BC Managed Care – PPO | Admitting: Adult Health

## 2019-09-03 VITALS — BP 151/88 | HR 81 | Ht 64.0 in | Wt 279.0 lb

## 2019-09-03 DIAGNOSIS — Z319 Encounter for procreative management, unspecified: Secondary | ICD-10-CM | POA: Diagnosis not present

## 2019-09-03 DIAGNOSIS — N926 Irregular menstruation, unspecified: Secondary | ICD-10-CM

## 2019-09-03 DIAGNOSIS — I10 Essential (primary) hypertension: Secondary | ICD-10-CM | POA: Diagnosis not present

## 2019-09-03 MED ORDER — AMLODIPINE BESYLATE 5 MG PO TABS: 5.0000 mg | ORAL_TABLET | Freq: Every day | ORAL | 6 refills | Status: DC

## 2019-09-03 MED ORDER — PRENATAL PLUS/IRON 27-1 MG PO TABS: ORAL_TABLET | ORAL | 12 refills | Status: DC

## 2019-09-03 NOTE — Progress Notes (Signed)
  Subjective:     Patient ID: Angela Harding, female   DOB: 07-02-1980, 39 y.o.   MRN: [id number]  HPI Angela Harding is a 39 year old white female,married back in follow up on taking provera, no period yet but cramping Husband has low Testosterone but Semen analysis was fine she says.  Review of Systems Still no periods after provera but cramping so may still come Reviewed past medical,surgical, social and family history. Reviewed medications and allergies.     Objective:   Physical Exam BP (!) 151/88 (BP Location: Right Arm, Patient Position: Sitting, Cuff Size: Normal)   Pulse 81   Ht 5\' 4"  (1.626 m)   Wt 279 lb (126.6 kg)   BMI 47.89 kg/m  Skin warm and dry. Lungs: clear to ausculation bilaterally. Cardiovascular: regular rate and rhythm.    Assessment:     1. Missed periods Call me if period starts  2. Patient desires pregnancy Will rx PNV Try to lose some weight and get husband on board too Get husband to increase vitamin C Review handouts on weight loss and clomid use   3. Hypertension, unspecified type Will add Norvasc Meds ordered this encounter  Medications  . amLODipine (NORVASC) 5 MG tablet    Sig: Take 1 tablet (5 mg total) by mouth daily.    Dispense:  30 tablet    Refill:  6    Order Specific Question:   Supervising Provider    Answer:   , LUTHER H [[id number]]  . Prenatal Vit-Fe Fumarate-FA (PRENATAL PLUS/IRON) 27-1 MG TABS    Sig: Take 1 daily    Dispense:  30 tablet    Refill:  12    Order Specific Question:   Supervising Provider    Answer:   Despina Hidden [[id number]]      Plan:    Return as scheduled 7/14 for pap and physical and recheck BP

## 2019-09-03 NOTE — Patient Instructions (Signed)
Calorie Counting for Weight Loss Calories are units of energy. Your body needs a certain amount of calories from food to keep you going throughout the day. When you eat more calories than your body needs, your body stores the extra calories as fat. When you eat fewer calories than your body needs, your body burns fat to get the energy it needs. Calorie counting means keeping track of how many calories you eat and drink each day. Calorie counting can be helpful if you need to lose weight. If you make sure to eat fewer calories than your body needs, you should lose weight. Ask your health care provider what a healthy weight is for you. For calorie counting to work, you will need to eat the right number of calories in a day in order to lose a healthy amount of weight per week. A dietitian can help you determine how many calories you need in a day and will give you suggestions on how to reach your calorie goal.  A healthy amount of weight to lose per week is usually 1-2 lb (0.5-0.9 kg). This usually means that your daily calorie intake should be reduced by 500-750 calories.  Eating 1,200 - 1,500 calories per day can help most women lose weight.  Eating 1,500 - 1,800 calories per day can help most men lose weight. What is my plan? My goal is to have 1500__________ calories per day. If I have this many calories per day, I should lose around ___1-2_______ pounds per week. What do I need to know about calorie counting? In order to meet your daily calorie goal, you will need to:  Find out how many calories are in each food you would like to eat. Try to do this before you eat.  Decide how much of the food you plan to eat.  Write down what you ate and how many calories it had. Doing this is called keeping a food log. To successfully lose weight, it is important to balance calorie counting with a healthy lifestyle that includes regular activity. Aim for 150 minutes of moderate exercise (such as walking) or  75 minutes of vigorous exercise (such as running) each week. Where do I find calorie information?  The number of calories in a food can be found on a Nutrition Facts label. If a food does not have a Nutrition Facts label, try to look up the calories online or ask your dietitian for help. Remember that calories are listed per serving. If you choose to have more than one serving of a food, you will have to multiply the calories per serving by the amount of servings you plan to eat. For example, the label on a package of bread might say that a serving size is 1 slice and that there are 90 calories in a serving. If you eat 1 slice, you will have eaten 90 calories. If you eat 2 slices, you will have eaten 180 calories. How do I keep a food log? Immediately after each meal, record the following information in your food log:  What you ate. Don't forget to include toppings, sauces, and other extras on the food.  How much you ate. This can be measured in cups, ounces, or number of items.  How many calories each food and drink had.  The total number of calories in the meal. Keep your food log near you, such as in a small notebook in your pocket, or use a mobile app or website. Some programs will calculate  calories for you and show you how many calories you have left for the day to meet your goal. What are some calorie counting tips?   Use your calories on foods and drinks that will fill you up and not leave you hungry: ? Some examples of foods that fill you up are nuts and nut butters, vegetables, lean proteins, and high-fiber foods like whole grains. High-fiber foods are foods with more than 5 g fiber per serving. ? Drinks such as sodas, specialty coffee drinks, alcohol, and juices have a lot of calories, yet do not fill you up.  Eat nutritious foods and avoid empty calories. Empty calories are calories you get from foods or beverages that do not have many vitamins or protein, such as candy, sweets,  and soda. It is better to have a nutritious high-calorie food (such as an avocado) than a food with few nutrients (such as a bag of chips).  Know how many calories are in the foods you eat most often. This will help you calculate calorie counts faster.  Pay attention to calories in drinks. Low-calorie drinks include water and unsweetened drinks.  Pay attention to nutrition labels for "low fat" or "fat free" foods. These foods sometimes have the same amount of calories or more calories than the full fat versions. They also often have added sugar, starch, or salt, to make up for flavor that was removed with the fat.  Find a way of tracking calories that works for you. Get creative. Try different apps or programs if writing down calories does not work for you. What are some portion control tips?  Know how many calories are in a serving. This will help you know how many servings of a certain food you can have.  Use a measuring cup to measure serving sizes. You could also try weighing out portions on a kitchen scale. With time, you will be able to estimate serving sizes for some foods.  Take some time to put servings of different foods on your favorite plates, bowls, and cups so you know what a serving looks like.  Try not to eat straight from a bag or box. Doing this can lead to overeating. Put the amount you would like to eat in a cup or on a plate to make sure you are eating the right portion.  Use smaller plates, glasses, and bowls to prevent overeating.  Try not to multitask (for example, watch TV or use your computer) while eating. If it is time to eat, sit down at a table and enjoy your food. This will help you to know when you are full. It will also help you to be aware of what you are eating and how much you are eating. What are tips for following this plan? Reading food labels  Check the calorie count compared to the serving size. The serving size may be smaller than what you are used  to eating.  Check the source of the calories. Make sure the food you are eating is high in vitamins and protein and low in saturated and trans fats. Shopping  Read nutrition labels while you shop. This will help you make healthy decisions before you decide to purchase your food.  Make a grocery list and stick to it. Cooking  Try to cook your favorite foods in a healthier way. For example, try baking instead of frying.  Use low-fat dairy products. Meal planning  Use more fruits and vegetables. Half of your plate should be fruits   and vegetables.  Include lean proteins like poultry and fish. How do I count calories when eating out?  Ask for smaller portion sizes.  Consider sharing an entree and sides instead of getting your own entree.  If you get your own entree, eat only half. Ask for a box at the beginning of your meal and put the rest of your entree in it so you are not tempted to eat it.  If calories are listed on the menu, choose the lower calorie options.  Choose dishes that include vegetables, fruits, whole grains, low-fat dairy products, and lean protein.  Choose items that are boiled, broiled, grilled, or steamed. Stay away from items that are buttered, battered, fried, or served with cream sauce. Items labeled "crispy" are usually fried, unless stated otherwise.  Choose water, low-fat milk, unsweetened iced tea, or other drinks without added sugar. If you want an alcoholic beverage, choose a lower calorie option such as a glass of wine or light beer.  Ask for dressings, sauces, and syrups on the side. These are usually high in calories, so you should limit the amount you eat.  If you want a salad, choose a garden salad and ask for grilled meats. Avoid extra toppings like bacon, cheese, or fried items. Ask for the dressing on the side, or ask for olive oil and vinegar or lemon to use as dressing.  Estimate how many servings of a food you are given. For example, a serving  of cooked rice is  cup or about the size of half a baseball. Knowing serving sizes will help you be aware of how much food you are eating at restaurants. The list below tells you how big or small some common portion sizes are based on everyday objects: ? 1 oz--4 stacked dice. ? 3 oz--1 deck of cards. ? 1 tsp--1 die. ? 1 Tbsp-- a ping-pong ball. ? 2 Tbsp--1 ping-pong ball. ?  cup-- baseball. ? 1 cup--1 baseball. Summary  Calorie counting means keeping track of how many calories you eat and drink each day. If you eat fewer calories than your body needs, you should lose weight.  A healthy amount of weight to lose per week is usually 1-2 lb (0.5-0.9 kg). This usually means reducing your daily calorie intake by 500-750 calories.  The number of calories in a food can be found on a Nutrition Facts label. If a food does not have a Nutrition Facts label, try to look up the calories online or ask your dietitian for help.  Use your calories on foods and drinks that will fill you up, and not on foods and drinks that will leave you hungry.  Use smaller plates, glasses, and bowls to prevent overeating. This information is not intended to replace advice given to you by your health care provider. Make sure you discuss any questions you have with your health care provider. Document Revised: 11/17/2017 Document Reviewed: 01/29/2016 Elsevier Patient Education  [place] USE IT? Clomid helps your ovaries to release eggs (ovulate).  HOW TO USE IT? Clomid is taken as a pill usually on days 5,6,7,8, & 9 of your cycle.  Day 1 is the first day of your period. The dose or duration may be changed to achieve ovulation.  Provera (progesterone) may first be used to bring on a period for some patients. The day of ovulation on Clomid is usually between cycle day 14 and 17.  Having sexual intercourse at least  every other day between cycle day 13 and 18 will improve your  chances of becoming pregnant during the Clomid cycle.  You may monitor your ovulation using basal body temperature charts or with ovulation kits.  If using the ovulation predictor kits, having intercourse the day of the surge and the two days following is recommended. If you get your period, call when it starts for an appointment with your doctor, so that an exam may be done, and another Clomid cycle can be considered if appropriate. If you do not get a period by day 35 of the cycle, please get a blood pregnancy test.  If it is negative, speak to your doctor for instructions to bring on another period and to plan a follow-up appointment.  THINGS TO KNOW: If you get pregnant while using Clomid, your chance of twins is 7%m and triplets is less than 1%. Some studies have suggested the use of "fertility drugs" may increase your risk of ovarian cancers in the future.  It is unclear if these drugs increase the risk, or people who have problems with fertility are prone for these cancers.  If there is an actual risk, it is very low.  If you have a history of liver problems or ovarian cancer, it may be wise to avoid this medication.  SIDE EFFECTS:  The most common side effect is hot flashes (20%).  Breast tenderness, headaches, nausea, bloating may also occur at different times.  Less than 3/1,000 people have dryness or loss of hair.  Persistent ovarian cysts may form from the use of this medication.  Ovarian hyperstimulation syndrome is a rare side effect at low doses.  Visual changes like flashes of light or blurring.

## 2019-09-09 ENCOUNTER — Telehealth: Payer: Self-pay | Admitting: *Deleted

## 2019-09-09 NOTE — Telephone Encounter (Signed)
Call when period starts for clomid, if not started by physical appt, cn rx clomid then

## 2019-09-09 NOTE — Telephone Encounter (Signed)
Pt is requesting a prescription on Clomiphene Citrate 50 mg. Please advise. Thanks!! JSY

## 2019-09-12 ENCOUNTER — Other Ambulatory Visit: Payer: Self-pay | Admitting: Adult Health

## 2019-09-12 DIAGNOSIS — Z319 Encounter for procreative management, unspecified: Secondary | ICD-10-CM

## 2019-09-12 NOTE — Progress Notes (Signed)
Ck progesterone level July 20

## 2019-09-13 ENCOUNTER — Other Ambulatory Visit: Payer: Self-pay | Admitting: Adult Health

## 2019-09-13 ENCOUNTER — Other Ambulatory Visit: Payer: Self-pay | Admitting: Obstetrics and Gynecology

## 2019-09-13 MED ORDER — CLOMIPHENE CITRATE 50 MG PO TABS: 50.0000 mg | ORAL_TABLET | Freq: Every day | ORAL | 1 refills | Status: DC

## 2019-09-25 ENCOUNTER — Other Ambulatory Visit (HOSPITAL_COMMUNITY)
Admission: RE | Admit: 2019-09-25 | Discharge: 2019-09-25 | Disposition: A | Discharge status: Home / Self Care | Payer: BC Managed Care – PPO | Source: Ambulatory Visit | Attending: Adult Health | Admitting: Adult Health

## 2019-09-25 ENCOUNTER — Encounter: Payer: Self-pay | Admitting: Adult Health

## 2019-09-25 ENCOUNTER — Ambulatory Visit (INDEPENDENT_AMBULATORY_CARE_PROVIDER_SITE_OTHER): Payer: BC Managed Care – PPO | Admitting: Adult Health

## 2019-09-25 VITALS — BP 140/90 | HR 63 | Ht 64.0 in | Wt 270.5 lb

## 2019-09-25 DIAGNOSIS — Z319 Encounter for procreative management, unspecified: Secondary | ICD-10-CM

## 2019-09-25 DIAGNOSIS — Z01419 Encounter for gynecological examination (general) (routine) without abnormal findings: Secondary | ICD-10-CM | POA: Diagnosis not present

## 2019-09-25 DIAGNOSIS — I10 Essential (primary) hypertension: Secondary | ICD-10-CM | POA: Diagnosis not present

## 2019-09-25 NOTE — Progress Notes (Signed)
Patient ID: Angela Harding, female   DOB: 26-Aug-1980, 39 y.o.   MRN: [id number] History of Present Illness: Angela Harding is a 39 year old white female, married, G2P1011, in for a well woman gyn exam and pap. She is on clomid.   Current Medications, Allergies, Past Medical History, Past Surgical History, Family History and Social History were reviewed in Angela Harding record.     Review of Systems: Patient denies any headaches, hearing loss, fatigue, blurred vision, shortness of breath, chest pain, abdominal pain, problems with bowel movements, urination, or intercourse. No joint pain or mood swings.    Physical Exam:BP 140/90 (BP Location: Left Arm, Patient Position: Sitting, Cuff Size: Large)    Pulse 63    Ht 5\' 4"  (1.626 m)    Wt 270 lb 8 oz (122.7 kg)    LMP 09/08/2019 (Approximate)    BMI 46.43 kg/m  General:  Well developed, well nourished, no acute distress Skin:  Warm and dry Neck:  Midline trachea, normal thyroid, good ROM, no lymphadenopathy Lungs; Clear to auscultation bilaterally Breast:  No dominant palpable mass, retraction, or nipple discharge Cardiovascular: Regular rate and rhythm Abdomen:  Soft, non tender, no hepatosplenomegaly Pelvic:  External genitalia is normal in appearance, no lesions.  The vagina is normal in appearance. Urethra has no lesions or masses. The cervix is bulbous.Pap with GC/CHL and high risk HPV 16/18 genotyping performed.  Uterus is felt to be normal size, shape, and contour.  No adnexal masses or tenderness noted.Bladder is non tender, no masses felt Extremities/musculoskeletal:  No swelling or varicosities noted, no clubbing or cyanosis Psych:  No mood changes, alert and cooperative,seems happy AA is 1 Fall risk is low PHQ 9 score is 3  [facility] - 09/25/19 1049      Pregnancy Intention Screening   Does the patient want to become pregnant in the next year? Yes    Does the patient's partner want to become pregnant in the next year?  Yes    Would the patient like to discuss contraceptive options today? No      Contraception Wrap Up   Current Method No Method - Other Reason    End Method No Method - Other Reason    Contraception Counseling Provided No          Examination chaperoned by 09/27/19 LPN  Impression and Plan: 1. Encounter for gynecological examination with Papanicolaou smear of cervix Pap sent Physical in 1 year Pap in 3 if normal  2. Patient desires pregnancy Check progesterone level 10/01/19 Take PNV  3. Hypertension, unspecified type Continue norvasc 5 mg has refills

## 2019-09-27 LAB — CYTOLOGY - PAP
Chlamydia: NEGATIVE
Comment: NEGATIVE
Comment: NEGATIVE
Comment: NORMAL
Diagnosis: NEGATIVE
High risk HPV: NEGATIVE
Neisseria Gonorrhea: NEGATIVE

## 2019-10-04 DIAGNOSIS — Z319 Encounter for procreative management, unspecified: Secondary | ICD-10-CM | POA: Diagnosis not present

## 2019-10-05 LAB — PROGESTERONE: Progesterone: 0.1 ng/mL

## 2019-10-16 DIAGNOSIS — J0100 Acute maxillary sinusitis, unspecified: Secondary | ICD-10-CM | POA: Diagnosis not present

## 2019-10-16 DIAGNOSIS — J0110 Acute frontal sinusitis, unspecified: Secondary | ICD-10-CM | POA: Diagnosis not present

## 2020-04-17 DIAGNOSIS — J0100 Acute maxillary sinusitis, unspecified: Secondary | ICD-10-CM | POA: Diagnosis not present

## 2020-11-17 DIAGNOSIS — Z20822 Contact with and (suspected) exposure to covid-19: Secondary | ICD-10-CM | POA: Diagnosis not present

## 2020-11-17 DIAGNOSIS — U071 COVID-19: Secondary | ICD-10-CM | POA: Diagnosis not present

## 2020-11-23 DIAGNOSIS — Z20822 Contact with and (suspected) exposure to covid-19: Secondary | ICD-10-CM | POA: Diagnosis not present

## 2021-02-09 DIAGNOSIS — L299 Pruritus, unspecified: Secondary | ICD-10-CM | POA: Diagnosis not present

## 2021-02-09 DIAGNOSIS — J301 Allergic rhinitis due to pollen: Secondary | ICD-10-CM | POA: Diagnosis not present

## 2021-02-09 DIAGNOSIS — J329 Chronic sinusitis, unspecified: Secondary | ICD-10-CM | POA: Diagnosis not present

## 2021-02-09 DIAGNOSIS — J3089 Other allergic rhinitis: Secondary | ICD-10-CM | POA: Diagnosis not present

## 2022-10-26 NOTE — Progress Notes (Signed)
Referring Provider: Wilmon Pali, FNP  Primary Care Physician:  Wilmon Pali, FNP Primary Gastroenterologist:  Dr. Marletta Lor  Chief Complaint  Patient presents with   Abdominal Pain    Chronic abd pain. Was seeing a GI in [place], wasn't happy with them. Has issues with constipation, tried Linzess 72 and it was too strong. States that she did have alpha gal but was tested last year and told that she wasn't allergic anymore but states that everything that she eats and drinks hurts her stomach.     HPI:   Angela Harding is a 42 y.o. female presenting today at the request of Wilmon Pali, FNP for abdominal pain.   Reviewed office visit with Coral Ceo, FNP dated 10/07/2022.  Patient reported history of alpha gal, stomach pain for the last 2 to 3 weeks postprandially.  PCP discussed acupuncture procedure for alpha gal and gave information for acupuncturist in [place].  She was also referred to GI for further evaluation.  Today:  Reports having epigastric abdominal pain daily regardless of what she eats or drinks.  States she does have history of chronic abdominal pain, but symptoms have been worsening over the last 4 to 5 months, now having pain constantly, even when eating a bland diet.  Occasional nausea.  No vomiting.  Rare heartburn.  She does report sensation of food sitting in upper or lower esophagus and will make herself vomit in these instances.  Also reports early satiety.  No unintentional weight loss.  No BRBPR or melena.  Bowels move every 2 days or so but are not productive.  States she will sit on the toilet and nothing will come out.  She has been trying to eat more fiber.  She was previously on omeprazole for years, but has not been on any PPI in quite some time.  Aleve once a month or less. No other NSAIDs.    Reports she had EGD and colonoscopy about 12 years ago in [place] when she was having GI issues and was told they didn't see anything. Was put on Linzess, but  this gave her diarrhea and didn't help her abdominal pain. Also tried MiraLAX. Saw allergist and was told she had alpha gal 8-9 years ago. Then saw a different allergist last year and had repeat testing that was negative.    Gallbladder in situ.  Maternal Grandmother had stomach cancer.  Maternal grandfather with colon cancer in his 48s.     Past Medical History:  Diagnosis Date   Allergy to alpha-gal    HTN (hypertension)    Reflux    Vertigo     Past Surgical History:  Procedure Laterality Date   CESAREAN SECTION     KNEE RECONSTRUCTION      Current Outpatient Medications  Medication Sig Dispense Refill   atorvastatin (LIPITOR) 20 MG tablet Take 20 mg by mouth daily.     fexofenadine (ALLEGRA) 180 MG tablet Take 180 mg by mouth daily.     fluticasone (FLONASE) 50 MCG/ACT nasal spray Place 1 spray into both nostrils daily for 14 days. 16 g 0   lisinopril (ZESTRIL) 5 MG tablet Take 5 mg by mouth daily.     pantoprazole (PROTONIX) 40 MG tablet Take 1 tablet (40 mg total) by mouth daily before breakfast. 30 tablet 3   No current facility-administered medications for this visit.    Allergies as of 10/27/2022 - Review Complete 10/27/2022  Allergen Reaction Noted   Alpha-gal  11/04/2021  Dairy aid [tilactase]  04/03/2013   Meat extract  04/03/2013    Family History  Problem Relation Age of Onset   Healthy Mother    Healthy Father    Other Maternal Grandmother        ulcer busted   Gastric cancer Maternal Grandmother    Alzheimer's disease Maternal Grandfather    Colon cancer Maternal Grandfather        30s   Cancer Paternal Grandfather    Cancer Other    Diabetes Other     Social History   Socioeconomic History   Marital status: Married    Spouse name: Not on file   Number of children: Not on file   Years of education: Not on file   Highest education level: Not on file  Occupational History   Not on file  Tobacco Use   Smoking status: Former    Current  packs/day: 0.00    Average packs/day: 0.5 packs/day for 3.0 years (1.5 ttl pk-yrs)    Types: Cigarettes    Start date: 03/15/1987    Quit date: 03/14/1990    Years since quitting: 32.6   Smokeless tobacco: Never  Vaping Use   Vaping status: Never Used  Substance and Sexual Activity   Alcohol use: No   Drug use: No   Sexual activity: Yes    Birth control/protection: None  Other Topics Concern   Not on file  Social History Narrative   Not on file   Social Determinants of Health   Financial Resource Strain: Low Risk  (09/25/2019)   Overall Financial Resource Strain (CARDIA)    Difficulty of Paying Living Expenses: Not hard at all  Food Insecurity: No Food Insecurity (09/25/2019)   Hunger Vital Sign    Worried About Running Out of Food in the Last Year: Never true    Ran Out of Food in the Last Year: Never true  Transportation Needs: No Transportation Needs (09/25/2019)   PRAPARE - Administrator, Civil Service (Medical): No    Lack of Transportation (Non-Medical): No  Physical Activity: Sufficiently Active (09/25/2019)   Exercise Vital Sign    Days of Exercise per Week: 4 days    Minutes of Exercise per Session: 60 min  Recent Concern: Physical Activity - Insufficiently Active (08/20/2019)   Exercise Vital Sign    Days of Exercise per Week: 3 days    Minutes of Exercise per Session: 40 min  Stress: Stress Concern Present (09/25/2019)   [employer] of Occupational Health - Occupational Stress Questionnaire    Feeling of Stress : To some extent  Social Connections: Moderately Integrated (09/25/2019)   Social Connection and Isolation Panel [NHANES]    Frequency of Communication with Friends and Family: More than three times a week    Frequency of Social Gatherings with Friends and Family: Once a week    Attends Religious Services: 1 to 4 times per year    Active Member of [employer] or Organizations: No    Attends Banker Meetings: Never    Marital Status:  Married  Catering manager Violence: Not At Risk (09/25/2019)   Humiliation, Afraid, Rape, and Kick questionnaire    Fear of Current or Ex-Partner: No    Emotionally Abused: No    Physically Abused: No    Sexually Abused: No    Review of Systems: Gen: Denies any fever, chills, cold or flulike symptoms, presyncope, syncope. CV: Denies chest pain, heart palpitations. Resp: Denies shortness of  breath, cough. GI: See HPI GU : Denies urinary burning, urinary frequency, urinary hesitancy MS: Denies joint pain. Derm: Denies rash. Psych: Denies depression, anxiety. Heme: See HPI  Physical Exam: BP (!) 142/82 (BP Location: Right Arm, Patient Position: Sitting, Cuff Size: Large)   Pulse (!) 56   Temp 98.2 F (36.8 C) (Oral)   Ht 5\' 4"  (1.626 m)   Wt 280 lb (127 kg)   LMP  (LMP Unknown)   SpO2 96%   BMI 48.06 kg/m  General:   Alert and oriented. Pleasant and cooperative. Well-nourished and well-developed.  Head:  Normocephalic and atraumatic. Eyes:  Without icterus, sclera clear and conjunctiva pink.  Ears:  Normal auditory acuity. Lungs:  Clear to auscultation bilaterally. No wheezes, rales, or rhonchi. No distress.  Heart:  S1, S2 present without murmurs appreciated.  Abdomen:  +BS, soft, non-distended.  TTP in epigastric area.  No HSM noted. No guarding or rebound. No masses appreciated.  Rectal:  Deferred  Msk:  Symmetrical without gross deformities. Normal posture. Extremities:  Without edema. Neurologic:  Alert and  oriented x4;  grossly normal neurologically. Skin:  Intact without significant lesions or rashes. Psych:  Normal mood and affect.    Assessment:  42 year old female with history of HTN, GERD, previously diagnosed with alpha gal, but reportedly had negative allergy test last year, presenting today for further evaluation of abdominal pain.  Epigastric abdominal pain: Chronic, but previously managed with following a bland diet, but now with worsening symptoms for  the last 3-4 months with daily abdominal pain, worsened postprandially without any specific food triggers.  Occasional nausea, but no vomiting.  Also with early satiety.  Rare NSAID use.  Not currently on PPI as she denies any routine reflux symptoms.  Reports EGD about 12 years ago in [place] with no significant findings.  I do note that she had an abdominal ultrasound in July 2014 noting a gallbladder polyp.  Differentials include gastritis, duodenitis, esophagitis, PUD, H. pylori, biliary etiology.  Will update labs, obtain RUQ ultrasound, and arrange EGD for further evaluation.  I will also go ahead and start her on pantoprazole daily.  Dysphagia: Intermittent solid food dysphagia with regurgitation.  May have esophageal web, ring, stricture.  Will arrange EGD for further evaluation.  Constipation: Chronic.  Not adequately managed as she is not taking anything for this.  Reports trial of Linzess previously caused abdominal pain and severe diarrhea.  MiraLAX not helpful.  I will try her on Trulance.   Plan:  CBC, CMP, lipase RUQ ultrasound Proceed with upper endoscopy +/- dilation with propofol by Dr. Marletta Lor in near future. The risks, benefits, and alternatives have been discussed with the patient in detail. The patient states understanding and desires to proceed.  ASA 3 UPT Start pantoprazole 40 mg daily. Avoid NSAIDs.  Start Trulance 3 mg daily.  Samples provided.  Requested progress report in 1 week. Follow-up after procedures.    Ermalinda Memos, PA-C [facility] 10/27/2022

## 2022-10-27 ENCOUNTER — Encounter: Payer: Self-pay | Admitting: *Deleted

## 2022-10-27 ENCOUNTER — Ambulatory Visit: Payer: 59 | Admitting: Gastroenterology

## 2022-10-27 ENCOUNTER — Encounter: Payer: Self-pay | Admitting: Gastroenterology

## 2022-10-27 VITALS — BP 142/82 | HR 56 | Temp 98.2°F | Ht 64.0 in | Wt 280.0 lb

## 2022-10-27 DIAGNOSIS — R1310 Dysphagia, unspecified: Secondary | ICD-10-CM | POA: Diagnosis not present

## 2022-10-27 DIAGNOSIS — R1013 Epigastric pain: Secondary | ICD-10-CM | POA: Insufficient documentation

## 2022-10-27 DIAGNOSIS — K824 Cholesterolosis of gallbladder: Secondary | ICD-10-CM | POA: Diagnosis not present

## 2022-10-27 DIAGNOSIS — K5900 Constipation, unspecified: Secondary | ICD-10-CM

## 2022-10-27 MED ORDER — PANTOPRAZOLE SODIUM 40 MG PO TBEC: 40.0000 mg | DELAYED_RELEASE_TABLET | Freq: Every day | ORAL | 3 refills | Status: DC

## 2022-10-27 NOTE — Patient Instructions (Addendum)
Please have blood work completed at [facility].  We will arrange for you to have an ultrasound of your gallbladder.  We will arrange for you to have an upper endoscopy with possible stretching of your esophagus in the near future with Dr. Marletta Lor.  Start pantoprazole 40 mg daily 30 minutes before breakfast.  Start Trulance 3 mg daily.  I am providing you with samples.  Please call next week with a progress report.  Avoid NSAID products including ibuprofen, Aleve, Advil, BC powders, Goody powders, anything that says "NSAID" on the package.  We will see back in the office after your procedures.  Will be in contact with you with your blood work and ultrasound results.  It was a pleasure to meet you today! I want to create trusting relationships with patients. If you receive a survey regarding your visit,  I greatly appreciate you taking time to fill this out on paper or through your MyChart. I value your feedback.  Ermalinda Memos, PA-C [facility]

## 2022-10-28 ENCOUNTER — Encounter: Payer: Self-pay | Admitting: Gastroenterology

## 2022-10-28 LAB — CMP14+EGFR
ALT: 19 IU/L (ref 0–32)
AST: 15 IU/L (ref 0–40)
Albumin: 4.1 g/dL (ref 3.9–4.9)
Alkaline Phosphatase: 97 IU/L (ref 44–121)
BUN/Creatinine Ratio: 16 (ref 9–23)
BUN: 11 mg/dL (ref 6–24)
Bilirubin Total: 0.5 mg/dL (ref 0.0–1.2)
CO2: 24 mmol/L (ref 20–29)
Calcium: 9.5 mg/dL (ref 8.7–10.2)
Chloride: 102 mmol/L (ref 96–106)
Creatinine, Ser: 0.7 mg/dL (ref 0.57–1.00)
Globulin, Total: 3.4 g/dL (ref 1.5–4.5)
Glucose: 82 mg/dL (ref 70–99)
Potassium: 4.5 mmol/L (ref 3.5–5.2)
Sodium: 141 mmol/L (ref 134–144)
Total Protein: 7.5 g/dL (ref 6.0–8.5)
eGFR: 112 mL/min/{1.73_m2} (ref >59)

## 2022-10-28 LAB — LIPASE: Lipase: 21 U/L (ref 14–72)

## 2022-10-28 LAB — CBC WITH DIFFERENTIAL/PLATELET
Basophils Absolute: 0 10*3/uL (ref 0.0–0.2)
Basos: 0 %
EOS (ABSOLUTE): 0.3 10*3/uL (ref 0.0–0.4)
Eos: 3 %
Hematocrit: 40.5 % (ref 34.0–46.6)
Hemoglobin: 13.9 g/dL (ref 11.1–15.9)
Immature Grans (Abs): 0 10*3/uL (ref 0.0–0.1)
Immature Granulocytes: 0 %
Lymphocytes Absolute: 4 10*3/uL — ABNORMAL HIGH (ref 0.7–3.1)
Lymphs: 37 %
MCH: 30.8 pg (ref 26.6–33.0)
MCHC: 34.3 g/dL (ref 31.5–35.7)
MCV: 90 fL (ref 79–97)
Monocytes Absolute: 0.4 10*3/uL (ref 0.1–0.9)
Monocytes: 4 %
Neutrophils Absolute: 6.1 10*3/uL (ref 1.4–7.0)
Neutrophils: 56 %
Platelets: 321 10*3/uL (ref 150–450)
RBC: 4.52 x10E6/uL (ref 3.77–5.28)
RDW: 12.3 % (ref 11.7–15.4)
WBC: 10.8 10*3/uL (ref 3.4–10.8)

## 2022-11-09 ENCOUNTER — Ambulatory Visit (HOSPITAL_COMMUNITY)
Admission: RE | Admit: 2022-11-09 | Discharge: 2022-11-09 | Disposition: A | Discharge status: Home / Self Care | Payer: 59 | Source: Ambulatory Visit | Attending: Gastroenterology | Admitting: Gastroenterology

## 2022-11-09 DIAGNOSIS — R1013 Epigastric pain: Secondary | ICD-10-CM | POA: Diagnosis present

## 2022-11-09 DIAGNOSIS — K824 Cholesterolosis of gallbladder: Secondary | ICD-10-CM | POA: Insufficient documentation

## 2022-11-23 NOTE — Patient Instructions (Signed)
20    Your procedure is scheduled on: 11/28/2022  [facility] at   10:45  AM.  Call this number if you have problems the morning of surgery: [phone]   Remember:   Follow instructions on letter from office regarding when to stop eating and drinking        No Smoking the day of procedure      Take these medicines the morning of surgery with A SIP OF WATER: Pantoprazole               Allegra and/or flonase if needed   Do not wear jewelry, make-up or nail polish.  Do not wear lotions, powders, or perfumes. You may wear deodorant.                Do not bring valuables to the hospital.  Contacts, dentures or bridgework may not be worn into surgery.  Leave suitcase in the car. After surgery it may be brought to your room.  For patients admitted to the hospital, checkout time is 11:00 AM the day of discharge.   Patients discharged the day of surgery will not be allowed to drive home. Upper Endoscopy, Adult Upper endoscopy is a procedure to look inside the upper GI (gastrointestinal) tract. The upper GI tract is made up of: The part of the body that moves food from your mouth to your stomach (esophagus). The stomach. The first part of your small intestine (duodenum). This procedure is also called esophagogastroduodenoscopy (EGD) or gastroscopy. In this procedure, your health care provider passes a thin, flexible tube (endoscope) through your mouth and down your esophagus into your stomach. A small camera is attached to the end of the tube. Images from the camera appear on a monitor in the exam room. During this procedure, your health care provider may also remove a small piece of tissue to be sent to a lab and examined under a microscope (biopsy). Your health care provider may do an upper endoscopy to diagnose cancers of the upper GI tract. You may also have this procedure to find the cause of other conditions, such as: Stomach pain. Heartburn. Pain or problems when  swallowing. Nausea and vomiting. Stomach bleeding. Stomach ulcers. Tell a health care provider about: Any allergies you have. All medicines you are taking, including vitamins, herbs, eye drops, creams, and over-the-counter medicines. Any problems you or family members have had with anesthetic medicines. Any blood disorders you have. Any surgeries you have had. Any medical conditions you have. Whether you are pregnant or may be pregnant. What are the risks? Generally, this is a safe procedure. However, problems may occur, including: Infection. Bleeding. Allergic reactions to medicines. A tear or hole (perforation) in the esophagus, stomach, or duodenum. What happens before the procedure? Staying hydrated Follow instructions from your health care provider about hydration, which may include: Up to 4 hours before the procedure - you may continue to drink clear liquids, such as water, clear fruit juice, black coffee, and plain tea.   Medicines Ask your health care provider about: Changing or stopping your regular medicines. This is especially important if you are taking diabetes medicines or blood thinners. Taking medicines such as aspirin and ibuprofen. These medicines can thin your blood. Do not take these medicines unless your health care provider tells you to take them. Taking over-the-counter medicines, vitamins, herbs, and supplements. General instructions Plan to have someone take you home from the hospital or clinic. If you will be  going home right after the procedure, plan to have someone with you for 24 hours. Ask your health care provider what steps will be taken to help prevent infection. What happens during the procedure?  An IV will be inserted into one of your veins. You may be given one or more of the following: A medicine to help you relax (sedative). A medicine to numb the throat (local anesthetic). You will lie on your left side on an exam table. Your health care  provider will pass the endoscope through your mouth and down your esophagus. Your health care provider will use the scope to check the inside of your esophagus, stomach, and duodenum. Biopsies may be taken. The endoscope will be removed. The procedure may vary among health care providers and hospitals. What happens after the procedure? Your blood pressure, heart rate, breathing rate, and blood oxygen level will be monitored until you leave the hospital or clinic. Do not drive for 24 hours if you were given a sedative during your procedure. When your throat is no longer numb, you may be given some fluids to drink. It is up to you to get the results of your procedure. Ask your health care provider, or the department that is doing the procedure, when your results will be ready. Summary Upper endoscopy is a procedure to look inside the upper GI tract. During the procedure, an IV will be inserted into one of your veins. You may be given a medicine to help you relax. A medicine will be used to numb your throat. The endoscope will be passed through your mouth and down your esophagus. This information is not intended to replace advice given to you by your health care provider. Make sure you discuss any questions you have with your health care provider. Document Revised: 08/23/2017 Document Reviewed: 07/31/2017 Elsevier Patient Education  2020 Elsevier Inc.                                                                                                                                      EndoscopyCare After  Please read the instructions outlined below and refer to this sheet in the next few weeks. These discharge instructions provide you with general information on caring for yourself after you leave the hospital. Your doctor may also give you specific instructions. While your treatment has been planned according to the most current medical practices available, unavoidable complications occasionally  occur. If you have any problems or questions after discharge, please call your doctor. HOME CARE INSTRUCTIONS Activity You may resume your regular activity but move at a slower pace for the next 24 hours.  Take frequent rest periods for the next 24 hours.  Walking will help expel (get rid of) the air and reduce the bloated feeling in your abdomen.  No driving for 24 hours (because of the anesthesia (medicine) used during the test).  You may shower.  Do not  sign any important legal documents or operate any machinery for 24 hours (because of the anesthesia used during the test).  Nutrition Drink plenty of fluids.  You may resume your normal diet.  Begin with a light meal and progress to your normal diet.  Avoid alcoholic beverages for 24 hours or as instructed by your caregiver.  Medications You may resume your normal medications unless your caregiver tells you otherwise. What you can expect today You may experience abdominal discomfort such as a feeling of fullness or "gas" pains.  You may experience a sore throat for 2 to 3 days. This is normal. Gargling with salt water may help this.  Follow-up Your doctor will discuss the results of your test with you. SEEK IMMEDIATE MEDICAL CARE IF: You have excessive nausea (feeling sick to your stomach) and/or vomiting.  You have severe abdominal pain and distention (swelling).  You have trouble swallowing.  You have a temperature over 100 F (37.8 C).  You have rectal bleeding or vomiting of blood.  Document Released: 10/13/2003 Document Revised: 02/17/2011 Document Reviewed: 04/25/2007  Esophageal Dilatation Esophageal dilatation, also called esophageal dilation, is a procedure to widen or open a blocked or narrowed part of the esophagus. The esophagus is the part of the body that moves food and liquid from the mouth to the stomach. You may need this procedure if: You have a buildup of scar tissue in your esophagus that makes it difficult,  painful, or impossible to swallow. This can be caused by gastroesophageal reflux disease (GERD). You have cancer of the esophagus. There is a problem with how food moves through your esophagus. In some cases, you may need this procedure repeated at a later time to dilate the esophagus gradually. Tell a health care provider about: Any allergies you have. All medicines you are taking, including vitamins, herbs, eye drops, creams, and over-the-counter medicines. Any problems you or family members have had with anesthetic medicines. Any blood disorders you have. Any surgeries you have had. Any medical conditions you have. Any antibiotic medicines you are required to take before dental procedures. Whether you are pregnant or may be pregnant. What are the risks? Generally, this is a safe procedure. However, problems may occur, including: Bleeding due to a tear in the lining of the esophagus. A hole, or perforation, in the esophagus. What happens before the procedure? Ask your health care provider about: Changing or stopping your regular medicines. This is especially important if you are taking diabetes medicines or blood thinners. Taking medicines such as aspirin and ibuprofen. These medicines can thin your blood. Do not take these medicines unless your health care provider tells you to take them. Taking over-the-counter medicines, vitamins, herbs, and supplements. Follow instructions from your health care provider about eating or drinking restrictions. Plan to have a responsible adult take you home from the hospital or clinic. Plan to have a responsible adult care for you for the time you are told after you leave the hospital or clinic. This is important. What happens during the procedure? You may be given a medicine to help you relax (sedative). A numbing medicine may be sprayed into the back of your throat, or you may gargle the medicine. Your health care provider may perform the dilatation  using various surgical instruments, such as: Simple dilators. This instrument is carefully placed in the esophagus to stretch it. Guided wire bougies. This involves using an endoscope to insert a wire into the esophagus. A dilator is passed over this wire  to enlarge the esophagus. Then the wire is removed. Balloon dilators. An endoscope with a small balloon is inserted into the esophagus. The balloon is inflated to stretch the esophagus and open it up. The procedure may vary among health care providers and hospitals. What can I expect after the procedure? Your blood pressure, heart rate, breathing rate, and blood oxygen level will be monitored until you leave the hospital or clinic. Your throat may feel slightly sore and numb. This will get better over time. You will not be allowed to eat or drink until your throat is no longer numb. When you are able to drink, urinate, and sit on the edge of the bed without nausea or dizziness, you may be able to return home. Follow these instructions at home: Take over-the-counter and prescription medicines only as told by your health care provider. If you were given a sedative during the procedure, it can affect you for several hours. Do not drive or operate machinery until your health care provider says that it is safe. Plan to have a responsible adult care for you for the time you are told. This is important. Follow instructions from your health care provider about any eating or drinking restrictions. Do not use any products that contain nicotine or tobacco, such as cigarettes, e-cigarettes, and chewing tobacco. If you need help quitting, ask your health care provider. Keep all follow-up visits. This is important. Contact a health care provider if: You have a fever. You have pain that is not relieved by medicine. Get help right away if: You have chest pain. You have trouble breathing. You have trouble swallowing. You vomit blood. You have black, tarry,  or bloody stools. These symptoms may represent a serious problem that is an emergency. Do not wait to see if the symptoms will go away. Get medical help right away. Call your local emergency services (911 in the [place] Do not drive yourself to the hospital. Summary Esophageal dilatation, also called esophageal dilation, is a procedure to widen or open a blocked or narrowed part of the esophagus. Plan to have a responsible adult take you home from the hospital or clinic. For this procedure, a numbing medicine may be sprayed into the back of your throat, or you may gargle the medicine. Do not drive or operate machinery until your health care provider says that it is safe. This information is not intended to replace advice given to you by your health care provider. Make sure you discuss any questions you have with your health care provider. Document Revised: 07/17/2019 Document Reviewed: 07/17/2019 Elsevier Patient Education  2024 [employer].

## 2022-11-24 ENCOUNTER — Encounter (HOSPITAL_COMMUNITY): Payer: Self-pay

## 2022-11-24 ENCOUNTER — Other Ambulatory Visit: Payer: Self-pay

## 2022-11-24 ENCOUNTER — Encounter (HOSPITAL_COMMUNITY)
Admission: RE | Admit: 2022-11-24 | Discharge: 2022-11-24 | Disposition: A | Discharge status: Home / Self Care | Payer: 59 | Source: Ambulatory Visit | Attending: Internal Medicine | Admitting: Internal Medicine

## 2022-11-24 VITALS — BP 148/79 | HR 70 | Temp 98.5°F | Resp 18 | Ht 64.0 in | Wt 280.0 lb

## 2022-11-24 DIAGNOSIS — Z01810 Encounter for preprocedural cardiovascular examination: Secondary | ICD-10-CM | POA: Diagnosis not present

## 2022-11-24 DIAGNOSIS — Z01812 Encounter for preprocedural laboratory examination: Secondary | ICD-10-CM | POA: Insufficient documentation

## 2022-11-24 DIAGNOSIS — R9431 Abnormal electrocardiogram [ECG] [EKG]: Secondary | ICD-10-CM | POA: Insufficient documentation

## 2022-11-24 DIAGNOSIS — I10 Essential (primary) hypertension: Secondary | ICD-10-CM | POA: Diagnosis not present

## 2022-11-24 DIAGNOSIS — Z01818 Encounter for other preprocedural examination: Secondary | ICD-10-CM

## 2022-11-24 HISTORY — DX: Other specified postprocedural states: Z98.890

## 2022-11-24 LAB — POCT PREGNANCY, URINE: Preg Test, Ur: NEGATIVE

## 2022-11-28 ENCOUNTER — Ambulatory Visit (HOSPITAL_COMMUNITY): Payer: 59 | Admitting: Anesthesiology

## 2022-11-28 ENCOUNTER — Encounter (HOSPITAL_COMMUNITY)
Admission: RE | Disposition: A | Discharge status: Home / Self Care | Payer: Self-pay | Source: Home / Self Care | Attending: Internal Medicine

## 2022-11-28 ENCOUNTER — Ambulatory Visit (HOSPITAL_BASED_OUTPATIENT_CLINIC_OR_DEPARTMENT_OTHER): Payer: 59 | Admitting: Anesthesiology

## 2022-11-28 ENCOUNTER — Encounter (HOSPITAL_COMMUNITY): Payer: Self-pay

## 2022-11-28 ENCOUNTER — Ambulatory Visit (HOSPITAL_COMMUNITY)
Admission: RE | Admit: 2022-11-28 | Discharge: 2022-11-28 | Disposition: A | Discharge status: Home / Self Care | Payer: 59 | Attending: Internal Medicine | Admitting: Internal Medicine

## 2022-11-28 DIAGNOSIS — Z87891 Personal history of nicotine dependence: Secondary | ICD-10-CM | POA: Insufficient documentation

## 2022-11-28 DIAGNOSIS — R1013 Epigastric pain: Secondary | ICD-10-CM | POA: Insufficient documentation

## 2022-11-28 DIAGNOSIS — I10 Essential (primary) hypertension: Secondary | ICD-10-CM

## 2022-11-28 DIAGNOSIS — R1310 Dysphagia, unspecified: Secondary | ICD-10-CM | POA: Insufficient documentation

## 2022-11-28 DIAGNOSIS — K2970 Gastritis, unspecified, without bleeding: Secondary | ICD-10-CM | POA: Diagnosis not present

## 2022-11-28 DIAGNOSIS — Z6841 Body Mass Index (BMI) 40.0 and over, adult: Secondary | ICD-10-CM | POA: Diagnosis not present

## 2022-11-28 HISTORY — PX: BIOPSY: SHX5522

## 2022-11-28 HISTORY — PX: ESOPHAGOGASTRODUODENOSCOPY (EGD) WITH PROPOFOL: SHX5813

## 2022-11-28 SURGERY — ESOPHAGOGASTRODUODENOSCOPY (EGD) WITH PROPOFOL
Anesthesia: General

## 2022-11-28 MED ORDER — LACTATED RINGERS IV SOLN: INTRAVENOUS | Status: DC

## 2022-11-28 MED ORDER — PROPOFOL 10 MG/ML IV BOLUS
INTRAVENOUS | Status: DC | PRN
  Administered 2022-11-28: 50 mg via INTRAVENOUS
  Administered 2022-11-28: 100 mg via INTRAVENOUS
  Administered 2022-11-28 (×2): 50 mg via INTRAVENOUS

## 2022-11-28 MED ORDER — LIDOCAINE HCL (CARDIAC) PF 100 MG/5ML IV SOSY
PREFILLED_SYRINGE | INTRAVENOUS | Status: DC | PRN
  Administered 2022-11-28: 50 mg via INTRAVENOUS

## 2022-11-28 NOTE — Anesthesia Preprocedure Evaluation (Signed)
Anesthesia Evaluation  Patient identified by MRN, date of birth, ID band Patient awake    Reviewed: Allergy & Precautions, H&P , NPO status , Patient's Chart, lab work & pertinent test results, reviewed documented beta blocker date and time   History of Anesthesia Complications (+) PONV and history of anesthetic complications  Airway Mallampati: II  TM Distance: >3 FB Neck ROM: full    Dental no notable dental hx.    Pulmonary neg pulmonary ROS, former smoker   Pulmonary exam normal breath sounds clear to auscultation       Cardiovascular Exercise Tolerance: Good hypertension, negative cardio ROS  Rhythm:regular Rate:Normal     Neuro/Psych negative neurological ROS  negative psych ROS   GI/Hepatic negative GI ROS, Neg liver ROS,,,  Endo/Other    Morbid obesity  Renal/GU negative Renal ROS  negative genitourinary   Musculoskeletal   Abdominal   Peds  Hematology negative hematology ROS (+)   Anesthesia Other Findings   Reproductive/Obstetrics negative OB ROS                             Anesthesia Physical Anesthesia Plan  ASA: 3  Anesthesia Plan: General   Post-op Pain Management:    Induction:   PONV Risk Score and Plan:   Airway Management Planned:   Additional Equipment:   Intra-op Plan:   Post-operative Plan:   Informed Consent: I have reviewed the patients History and Physical, chart, labs and discussed the procedure including the risks, benefits and alternatives for the proposed anesthesia with the patient or authorized representative who has indicated his/her understanding and acceptance.     Dental Advisory Given  Plan Discussed with: CRNA  Anesthesia Plan Comments:        Anesthesia Quick Evaluation

## 2022-11-28 NOTE — Anesthesia Procedure Notes (Signed)
Date/Time: 11/28/2022 12:15 AM  Performed by: Julian Reil, CRNAPre-anesthesia Checklist: Suction available, Emergency Drugs available, Patient identified and Patient being monitored Patient Re-evaluated:Patient Re-evaluated prior to induction Oxygen Delivery Method: Nasal cannula Induction Type: IV induction Placement Confirmation: positive ETCO2 Comments: Optiflow High Flow [place] O2 used.

## 2022-11-28 NOTE — Discharge Instructions (Signed)
EGD Discharge instructions Please read the instructions outlined below and refer to this sheet in the next few weeks. These discharge instructions provide you with general information on caring for yourself after you leave the hospital. Your doctor may also give you specific instructions. While your treatment has been planned according to the most current medical practices available, unavoidable complications occasionally occur. If you have any problems or questions after discharge, please call your doctor. ACTIVITY You may resume your regular activity but move at a slower pace for the next 24 hours.  Take frequent rest periods for the next 24 hours.  Walking will help expel (get rid of) the air and reduce the bloated feeling in your abdomen.  No driving for 24 hours (because of the anesthesia (medicine) used during the test).  You may shower.  Do not sign any important legal documents or operate any machinery for 24 hours (because of the anesthesia used during the test).  NUTRITION Drink plenty of fluids.  You may resume your normal diet.  Begin with a light meal and progress to your normal diet.  Avoid alcoholic beverages for 24 hours or as instructed by your caregiver.  MEDICATIONS You may resume your normal medications unless your caregiver tells you otherwise.  WHAT YOU CAN EXPECT TODAY You may experience abdominal discomfort such as a feeling of fullness or "gas" pains.  FOLLOW-UP Your doctor will discuss the results of your test with you.  SEEK IMMEDIATE MEDICAL ATTENTION IF ANY OF THE FOLLOWING OCCUR: Excessive nausea (feeling sick to your stomach) and/or vomiting.  Severe abdominal pain and distention (swelling).  Trouble swallowing.  Temperature over 101 F (37.8 C).  Rectal bleeding or vomiting of blood.   Your esophagus was wide open, I did not need to stretch it today.  I did take samples of your esophagus to rule out a condition called eosinophilic esophagitis.  Your EGD  revealed mild amount inflammation in your stomach.  I took biopsies of this to rule out infection with a bacteria called H. pylori.  Small bowel appeared normal, I did take samples of it as well to rule out underlying celiac disease/gluten intolerance.    Await pathology results, my office will contact you.  Continue on pantoprazole daily.  Follow-up in GI office in 2 to 3 months.  I hope you have a great rest of your week!  Hennie Duos. Marletta Lor, D.O. Gastroenterology and Hepatology [facility]

## 2022-11-28 NOTE — Transfer of Care (Signed)
Immediate Anesthesia Transfer of Care Note  Patient: Angela Harding  Procedure(s) Performed: ESOPHAGOGASTRODUODENOSCOPY (EGD) WITH PROPOFOL BIOPSY  Patient Location: Short Stay  Anesthesia Type:General  Level of Consciousness: drowsy  Airway & Oxygen Therapy: Patient Spontanous Breathing  Post-op Assessment: Report given to RN and Post -op Vital signs reviewed and stable  Post vital signs: Reviewed and stable  Last Vitals:  Vitals Value Taken Time  BP    Temp    Pulse    Resp    SpO2      Last Pain:  Vitals:   11/28/22 1016  PainSc: 0-No pain         Complications: No notable events documented.

## 2022-11-28 NOTE — Op Note (Signed)
[facility] Patient Name: Angela Harding Procedure Date: 11/28/2022 10:05 AM MRN: [id number] Date of Birth: [birth year] Attending MD: Hennie Duos. Marletta Lor , [place], [id number] CSN: [id number] Age: 42 Admit Type: Outpatient Procedure:                Upper GI endoscopy Indications:              Epigastric abdominal pain, Dysphagia Providers:                Hennie Duos. Marletta Lor, DO, Sheran Fava, Pandora Leiter, Technician Referring MD:              Medicines:                See the Anesthesia note for documentation of the                            administered medications Complications:            No immediate complications. Estimated Blood Loss:     Estimated blood loss was minimal. Procedure:                Pre-Anesthesia Assessment:                           - The anesthesia plan was to use monitored                            anesthesia care (MAC).                           After obtaining informed consent, the endoscope was                            passed under direct vision. Throughout the                            procedure, the patient's blood pressure, pulse, and                            oxygen saturations were monitored continuously. The                            [id number]-H190 ([id number]) scope was introduced through the                            mouth, and advanced to the second part of duodenum.                            The upper GI endoscopy was accomplished without                            difficulty. The patient tolerated the procedure                            well. Scope In: 10:19:52  AM Scope Out: 10:24:31 AM Total Procedure Duration: 0 hours 4 minutes 39 seconds  Findings:      There is no endoscopic evidence of stenosis or stricture in the entire       esophagus.      Biopsies were taken with a cold forceps in the middle third of the       esophagus for histology.      Patchy mild inflammation characterized by erythema was found in the        gastric body and in the gastric antrum. Biopsies were taken with a cold       forceps for Helicobacter pylori testing.      The duodenal bulb, first portion of the duodenum and second portion of       the duodenum were normal. Biopsies for histology were taken with a cold       forceps for evaluation of celiac disease. Impression:               - Gastritis. Biopsied.                           - Normal duodenal bulb, first portion of the                            duodenum and second portion of the duodenum.                            Biopsied.                           - Biopsies were taken with a cold forceps for                            histology in the middle third of the esophagus. Moderate Sedation:      Per Anesthesia Care Recommendation:           - Patient has a contact number available for                            emergencies. The signs and symptoms of potential                            delayed complications were discussed with the                            patient. Return to normal activities tomorrow.                            Written discharge instructions were provided to the                            patient.                           - Resume previous diet.                           - Continue present medications.                           -  Await pathology results.                           - Use Protonix (pantoprazole) 40 mg PO daily.                           - Return to GI clinic in 3 months. Procedure Code(s):        --- Professional ---                           [phone], Esophagogastroduodenoscopy, flexible,                            transoral; with biopsy, single or multiple Diagnosis Code(s):        --- Professional ---                           K29.70, Gastritis, unspecified, without bleeding                           R10.13, Epigastric pain                           R13.10, Dysphagia, unspecified CPT copyright 2022 American Medical Association.  All rights reserved. The codes documented in this report are preliminary and upon coder review may  be revised to meet current compliance requirements. Hennie Duos. Marletta Lor, DO Hennie Duos. Marletta Lor, DO 11/28/2022 10:26:27 AM This report has been signed electronically. Number of Addenda: 0

## 2022-11-28 NOTE — H&P (Signed)
Primary Care Physician:  Wilmon Pali, FNP Primary Gastroenterologist:  Dr. Marletta Lor  Pre-Procedure History & Physical: HPI:  Angela Harding is a 42 y.o. female is here for an EGD with possible dilation to be performed for epigastric pain, dysphagia   Past Medical History:  Diagnosis Date   Allergy to alpha-gal    HTN (hypertension)    PONV (postoperative nausea and vomiting)    Reflux    Vertigo     Past Surgical History:  Procedure Laterality Date   CESAREAN SECTION     KNEE RECONSTRUCTION      Prior to Admission medications   Medication Sig Start Date End Date Taking? Authorizing Provider  atorvastatin (LIPITOR) 20 MG tablet Take 20 mg by mouth daily. 10/04/22  Yes [provider]  cetirizine (ZYRTEC) 10 MG tablet Take 10 mg by mouth daily.   Yes [provider]  fexofenadine (ALLEGRA) 180 MG tablet Take 180 mg by mouth daily.   Yes [provider]  lisinopril (ZESTRIL) 5 MG tablet Take 5 mg by mouth daily. 10/04/22  Yes [provider]  pantoprazole (PROTONIX) 40 MG tablet Take 1 tablet (40 mg total) by mouth daily before breakfast. 10/27/22  Yes Clearance Coots, Kristen S, PA-C  fluticasone (FLONASE) 50 MCG/ACT nasal spray Place 1 spray into both nostrils daily for 14 days. Patient taking differently: Place 1 spray into both nostrils daily. 04/08/19 10/27/22  Durward Parcel, FNP    Allergies as of 10/27/2022 - Review Complete 10/27/2022  Allergen Reaction Noted   Alpha-gal  11/04/2021   Dairy aid [tilactase]  04/03/2013   Meat extract  04/03/2013    Family History  Problem Relation Age of Onset   Healthy Mother    Healthy Father    Other Maternal Grandmother        ulcer busted   Gastric cancer Maternal Grandmother    Alzheimer's disease Maternal Grandfather    Colon cancer Maternal Grandfather        14s   Cancer Paternal Grandfather    Cancer Other    Diabetes Other     Social History   Socioeconomic History   Marital status:  Married    Spouse name: Not on file   Number of children: Not on file   Years of education: Not on file   Highest education level: Not on file  Occupational History   Not on file  Tobacco Use   Smoking status: Former    Current packs/day: 0.00    Average packs/day: 0.5 packs/day for 3.0 years (1.5 ttl pk-yrs)    Types: Cigarettes    Start date: 03/15/1987    Quit date: 03/14/1990    Years since quitting: 32.7   Smokeless tobacco: Never  Vaping Use   Vaping status: Never Used  Substance and Sexual Activity   Alcohol use: No   Drug use: No   Sexual activity: Yes    Birth control/protection: None  Other Topics Concern   Not on file  Social History Narrative   Not on file   Social Determinants of Health   Financial Resource Strain: Low Risk  (09/25/2019)   Overall Financial Resource Strain (CARDIA)    Difficulty of Paying Living Expenses: Not hard at all  Food Insecurity: No Food Insecurity (09/25/2019)   Hunger Vital Sign    Worried About Running Out of Food in the Last Year: Never true    Ran Out of Food in the Last Year: Never true  Transportation  Needs: No Transportation Needs (09/25/2019)   PRAPARE - Administrator, Civil Service (Medical): No    Lack of Transportation (Non-Medical): No  Physical Activity: Sufficiently Active (09/25/2019)   Exercise Vital Sign    Days of Exercise per Week: 4 days    Minutes of Exercise per Session: 60 min  Recent Concern: Physical Activity - Insufficiently Active (08/20/2019)   Exercise Vital Sign    Days of Exercise per Week: 3 days    Minutes of Exercise per Session: 40 min  Stress: Stress Concern Present (09/25/2019)   [employer] of Occupational Health - Occupational Stress Questionnaire    Feeling of Stress : To some extent  Social Connections: Moderately Integrated (09/25/2019)   Social Connection and Isolation Panel [NHANES]    Frequency of Communication with Friends and Family: More than three times a week     Frequency of Social Gatherings with Friends and Family: Once a week    Attends Religious Services: 1 to 4 times per year    Active Member of [employer] or Organizations: No    Attends Banker Meetings: Never    Marital Status: Married  Catering manager Violence: Not At Risk (09/25/2019)   Humiliation, Afraid, Rape, and Kick questionnaire    Fear of Current or Ex-Partner: No    Emotionally Abused: No    Physically Abused: No    Sexually Abused: No    Review of Systems: General: Negative for fever, chills, fatigue, weakness. Eyes: Negative for vision changes.  ENT: Negative for hoarseness, difficulty swallowing , nasal congestion. CV: Negative for chest pain, angina, palpitations, dyspnea on exertion, peripheral edema.  Respiratory: Negative for dyspnea at rest, dyspnea on exertion, cough, sputum, wheezing.  GI: See history of present illness. GU:  Negative for dysuria, hematuria, urinary incontinence, urinary frequency, nocturnal urination.  MS: Negative for joint pain, low back pain.  Derm: Negative for rash or itching.  Neuro: Negative for weakness, abnormal sensation, seizure, frequent headaches, memory loss, confusion.  Psych: Negative for anxiety, depression Endo: Negative for unusual weight change.  Heme: Negative for bruising or bleeding. Allergy: Negative for rash or hives.  Physical Exam: Vital signs in last 24 hours: Temp:  [98.4 F (36.9 C)] 98.4 F (36.9 C) (09/16 0931) Pulse Rate:  [62] 62 (09/16 0931) Resp:  [12] 12 (09/16 0931) BP: (185)/(92) 185/92 (09/16 0931) SpO2:  [99 %] 99 % (09/16 0931)   General:   Alert,  Well-developed, well-nourished, pleasant and cooperative in NAD Head:  Normocephalic and atraumatic. Eyes:  Sclera clear, no icterus.   Conjunctiva pink. Ears:  Normal auditory acuity. Nose:  No deformity, discharge,  or lesions. Msk:  Symmetrical without gross deformities. Normal posture. Extremities:  Without clubbing or  edema. Neurologic:  Alert and  oriented x4;  grossly normal neurologically. Skin:  Intact without significant lesions or rashes. Psych:  Alert and cooperative. Normal mood and affect.   Impression/Plan: Angela Harding is here for an EGD with possible dilation to be performed for epigastric pain, dysphagia   Risks, benefits, limitations, imponderables and alternatives regarding procedure have been reviewed with the patient. Questions have been answered. All parties agreeable.

## 2022-11-29 LAB — SURGICAL PATHOLOGY

## 2022-12-03 NOTE — Anesthesia Postprocedure Evaluation (Signed)
Anesthesia Post Note  Patient: Angela Harding  Procedure(s) Performed: ESOPHAGOGASTRODUODENOSCOPY (EGD) WITH PROPOFOL BIOPSY  Patient location during evaluation: Phase II Anesthesia Type: General Level of consciousness: awake Pain management: pain level controlled Vital Signs Assessment: post-procedure vital signs reviewed and stable Respiratory status: spontaneous breathing and respiratory function stable Cardiovascular status: blood pressure returned to baseline and stable Postop Assessment: no headache and no apparent nausea or vomiting Anesthetic complications: no Comments: Late entry   No notable events documented.   Last Vitals:  Vitals:   11/28/22 0931 11/28/22 1035  BP: (!) 185/92 127/71  Pulse: 62 66  Resp: 12 16  Temp: 36.9 C 36.7 C  SpO2: 99% 100%    Last Pain:  Vitals:   11/29/22 1427  TempSrc:   PainSc: 0-No pain                 Windell Norfolk

## 2022-12-07 ENCOUNTER — Encounter (HOSPITAL_COMMUNITY): Payer: Self-pay | Admitting: Internal Medicine

## 2022-12-13 NOTE — Telephone Encounter (Signed)
Pt's EKG has been faxed to Lance Sell, FNP-C @ [phone].

## 2023-01-24 ENCOUNTER — Encounter: Payer: Self-pay | Admitting: Internal Medicine

## 2023-02-09 ENCOUNTER — Other Ambulatory Visit: Payer: Self-pay | Admitting: Gastroenterology

## 2023-02-09 DIAGNOSIS — R1013 Epigastric pain: Secondary | ICD-10-CM
